# Patient Record
Sex: Male | Born: 1990
Health system: Southern US, Community
[De-identification: ages and names within clinical notes are randomized; demographics above are authoritative.]

## PROBLEM LIST (undated history)

## (undated) DIAGNOSIS — R109 Unspecified abdominal pain: Secondary | ICD-10-CM

## (undated) DIAGNOSIS — J45909 Unspecified asthma, uncomplicated: Secondary | ICD-10-CM

## (undated) DIAGNOSIS — H9190 Unspecified hearing loss, unspecified ear: Secondary | ICD-10-CM

## (undated) DIAGNOSIS — J309 Allergic rhinitis, unspecified: Secondary | ICD-10-CM

## (undated) HISTORY — DX: Unspecified asthma, uncomplicated: J45.909

## (undated) HISTORY — PX: EAR TUBE REMOVAL: SHX1486

## (undated) HISTORY — PX: OTHER SURGICAL HISTORY: SHX169

## (undated) HISTORY — DX: Allergic rhinitis, unspecified: J30.9

## (undated) HISTORY — DX: Unspecified abdominal pain: R10.9

## (undated) HISTORY — DX: Unspecified hearing loss, unspecified ear: H91.90

---
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
## 2010-05-23 HISTORY — PX: SHOULDER SURGERY: SHX246

## 2010-06-04 ENCOUNTER — Ambulatory Visit: Payer: Self-pay | Admitting: Family Medicine

## 2010-12-21 ENCOUNTER — Ambulatory Visit: Payer: Self-pay | Admitting: Orthopedic Surgery

## 2011-02-14 ENCOUNTER — Ambulatory Visit: Payer: Self-pay | Admitting: Orthopedic Surgery

## 2012-11-10 ENCOUNTER — Emergency Department: Payer: Self-pay | Admitting: Internal Medicine

## 2013-11-11 ENCOUNTER — Emergency Department: Payer: Self-pay | Admitting: Emergency Medicine

## 2014-03-14 ENCOUNTER — Emergency Department: Payer: Self-pay | Admitting: Emergency Medicine

## 2014-12-08 ENCOUNTER — Ambulatory Visit (INDEPENDENT_AMBULATORY_CARE_PROVIDER_SITE_OTHER): Payer: Self-pay | Admitting: Family Medicine

## 2014-12-08 ENCOUNTER — Encounter: Payer: Self-pay | Admitting: Family Medicine

## 2014-12-08 VITALS — BP 110/72 | HR 59 | Temp 98.7°F | Resp 16 | Ht 60.0 in | Wt 181.4 lb

## 2014-12-08 DIAGNOSIS — Z8669 Personal history of other diseases of the nervous system and sense organs: Secondary | ICD-10-CM | POA: Insufficient documentation

## 2014-12-08 DIAGNOSIS — J302 Other seasonal allergic rhinitis: Secondary | ICD-10-CM | POA: Insufficient documentation

## 2014-12-08 DIAGNOSIS — R51 Headache: Secondary | ICD-10-CM

## 2014-12-08 DIAGNOSIS — G8929 Other chronic pain: Secondary | ICD-10-CM | POA: Insufficient documentation

## 2014-12-08 DIAGNOSIS — J309 Allergic rhinitis, unspecified: Secondary | ICD-10-CM

## 2014-12-08 DIAGNOSIS — J45909 Unspecified asthma, uncomplicated: Secondary | ICD-10-CM | POA: Insufficient documentation

## 2014-12-08 DIAGNOSIS — J029 Acute pharyngitis, unspecified: Secondary | ICD-10-CM

## 2014-12-08 DIAGNOSIS — R519 Headache, unspecified: Secondary | ICD-10-CM | POA: Insufficient documentation

## 2014-12-08 HISTORY — DX: Allergic rhinitis, unspecified: J30.9

## 2014-12-08 LAB — POCT RAPID STREP A (OFFICE): Rapid Strep A Screen: NEGATIVE

## 2014-12-08 MED ORDER — AMOXICILLIN 875 MG PO TABS: 875.0000 mg | ORAL_TABLET | Freq: Two times a day (BID) | ORAL | Status: DC

## 2014-12-08 NOTE — Progress Notes (Signed)
Subjective:     Patient ID: Kyle MandrilMatthew D Marshall, male   DOB: 1990-06-25, 24 y.o.   MRN: [id number]  HPI  Chief Complaint  Patient presents with  . Sore Throat    patient comes in office today with complaints of sore throat x days. Patient describes throat as burning and painful when swallowing liquids or solids.   No cold sx reported but states he treated allergy sx two weeks ago with improvement. Does report a lot of heartburn in association with heavy use of soda and more than one pillow at night. He is accompanied by his wife and young daughter.   Review of Systems  Constitutional: Negative for fever and chills.  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 Usually takes two BC's twice daily which he states irritates his stomach.       Objective:   Physical Exam  Constitutional: He appears well-developed and well-nourished.  Ears: T.M's intact without inflammation Throat: moderate erythema with mild tonsillar enlargement but no exudate Neck: no cervical adenopathy Lungs: clear     Assessment:    1. Pharyngitis-? Mediated by reflux ? infectious - POCT rapid strep A - amoxicillin (AMOXIL) 875 MG tablet; Take 1 tablet (875 mg total) by mouth 2 (two) times daily.  Dispense: 20 tablet; Refill: 0    Plan:    Try reflux relief measures first with HOB elevation, decreased soda intake, and use of Zantac 75 at bedtime. If throat not improving start amoxil. May try ibuprofen 400 mg. with meals for knee pain.

## 2014-12-08 NOTE — Patient Instructions (Signed)
Start amoxicillin if your throat does not improve in the next 24 hours. For heartburn put the head of your bed legs on 6 inch blocks and decrease amount of caffeine your are drinking. May consider taking Zantac 75 for two to four weeks. For knee pain try two ibuprofen with food 3 x day with meals.

## 2015-01-01 ENCOUNTER — Encounter: Payer: Self-pay | Admitting: *Deleted

## 2015-01-01 ENCOUNTER — Emergency Department
Admission: EM | Admit: 2015-01-01 | Discharge: 2015-01-01 | Disposition: A | Discharge status: Home / Self Care | Payer: Medicaid Other | Attending: Emergency Medicine | Admitting: Emergency Medicine

## 2015-01-01 ENCOUNTER — Other Ambulatory Visit: Payer: Self-pay

## 2015-01-01 DIAGNOSIS — R110 Nausea: Secondary | ICD-10-CM | POA: Insufficient documentation

## 2015-01-01 DIAGNOSIS — Z79899 Other long term (current) drug therapy: Secondary | ICD-10-CM | POA: Insufficient documentation

## 2015-01-01 DIAGNOSIS — R55 Syncope and collapse: Secondary | ICD-10-CM | POA: Insufficient documentation

## 2015-01-01 DIAGNOSIS — Z792 Long term (current) use of antibiotics: Secondary | ICD-10-CM | POA: Diagnosis not present

## 2015-01-01 LAB — CBC
HCT: 44.4 % (ref 40.0–52.0)
Hemoglobin: 15.1 g/dL (ref 13.0–18.0)
MCH: 31.2 pg (ref 26.0–34.0)
MCHC: 33.9 g/dL (ref 32.0–36.0)
MCV: 92.1 fL (ref 80.0–100.0)
PLATELETS: 200 10*3/uL (ref 150–440)
RBC: 4.83 MIL/uL (ref 4.40–5.90)
RDW: 13.2 % (ref 11.5–14.5)
WBC: 6.9 10*3/uL (ref 3.8–10.6)

## 2015-01-01 LAB — BASIC METABOLIC PANEL
Anion gap: 4 — ABNORMAL LOW (ref 5–15)
BUN: 9 mg/dL (ref 6–20)
CALCIUM: 9.2 mg/dL (ref 8.9–10.3)
CO2: 26 mmol/L (ref 22–32)
CREATININE: 0.92 mg/dL (ref 0.61–1.24)
Chloride: 108 mmol/L (ref 101–111)
GFR calc Af Amer: >60 mL/min (ref >60)
GFR calc non Af Amer: >60 mL/min (ref >60)
Glucose, Bld: 96 mg/dL (ref 65–99)
Potassium: 4.8 mmol/L (ref 3.5–5.1)
SODIUM: 138 mmol/L (ref 135–145)

## 2015-01-01 LAB — TROPONIN I

## 2015-01-01 MED ORDER — PANTOPRAZOLE SODIUM 40 MG PO TBEC: 40.0000 mg | DELAYED_RELEASE_TABLET | Freq: Every day | ORAL | Status: DC

## 2015-01-01 MED ORDER — ONDANSETRON 4 MG PO TBDP: 4.0000 mg | ORAL_TABLET | Freq: Three times a day (TID) | ORAL | Status: DC | PRN

## 2015-01-01 NOTE — ED Notes (Signed)
Today developed nausea,.had syncope episode witnessed by gf, had some shakes, his left arm went numb has been doing this for several months, , recived zofran per ems

## 2015-01-01 NOTE — Discharge Instructions (Signed)
Vasovagal Syncope, Adult °Syncope, commonly known as fainting, is a temporary loss of consciousness. It occurs when the blood flow to the brain is reduced. Vasovagal syncope (also called neurocardiogenic syncope) is a fainting spell in which the blood flow to the brain is reduced because of a sudden drop in heart rate and blood pressure. Vasovagal syncope occurs when the brain and the cardiovascular system (blood vessels) do not adequately communicate and respond to each other. This is the most common cause of fainting. It often occurs in response to fear or some other type of emotional or physical stress. The body has a reaction in which the heart starts beating too slowly or the blood vessels expand, reducing blood pressure. This type of fainting spell is generally considered harmless. However, injuries can occur if a person takes a sudden fall during a fainting spell.  °CAUSES  °Vasovagal syncope occurs when a person's blood pressure and heart rate decrease suddenly, usually in response to a trigger. Many things and situations can trigger an episode. Some of these include:  °· Pain.   °· Fear.   °· The sight of blood or medical procedures, such as blood being drawn from a vein.   °· Common activities, such as coughing, swallowing, stretching, or going to the bathroom.   °· Emotional stress.   °· Prolonged standing, especially in a warm environment.   °· Lack of sleep or rest.   °· Prolonged lack of food.   °· Prolonged lack of fluids.   °· Recent illness. °· The use of certain drugs that affect blood pressure, such as cocaine, alcohol, marijuana, inhalants, and opiates.   °SYMPTOMS  °Before the fainting episode, you may:  °· Feel dizzy or light headed.   °· Become pale. °· Sense that you are going to faint.   °· Feel like the room is spinning.   °· Have tunnel vision, only seeing directly in front of you.   °· Feel sick to your stomach (nauseous).   °· See spots or slowly lose vision.   °· Hear ringing in your  ears.   °· Have a headache.   °· Feel warm and sweaty.   °· Feel a sensation of pins and needles. °During the fainting spell, you will generally be unconscious for no longer than a couple minutes before waking up and returning to normal. If you get up too quickly before your body can recover, you may faint again. Some twitching or jerky movements may occur during the fainting spell.  °DIAGNOSIS  °Your caregiver will ask about your symptoms, take a medical history, and perform a physical exam. Various tests may be done to rule out other causes of fainting. These may include blood tests and tests to check the heart, such as electrocardiography, echocardiography, and possibly an electrophysiology study. When other causes have been ruled out, a test may be done to check the body's response to changes in position (tilt table test). °TREATMENT  °Most cases of vasovagal syncope do not require treatment. Your caregiver may recommend ways to avoid fainting triggers and may provide home strategies for preventing fainting. If you must be exposed to a possible trigger, you can drink additional fluids to help reduce your chances of having an episode of vasovagal syncope. If you have warning signs of an oncoming episode, you can respond by positioning yourself favorably (lying down). °If your fainting spells continue, you may be given medicines to prevent fainting. Some medicines may help make you more resistant to repeated episodes of vasovagal syncope. Special exercises or compression stockings may be recommended. In rare cases, the surgical placement   of a pacemaker is considered. °HOME CARE INSTRUCTIONS  °· Learn to identify the warning signs of vasovagal syncope.   °· Sit or lie down at the first warning sign of a fainting spell. If sitting, put your head down between your legs. If you lie down, swing your legs up in the air to increase blood flow to the brain.   °· Avoid hot tubs and saunas. °· Avoid prolonged  standing. °· Drink enough fluids to keep your urine clear or pale yellow. Avoid caffeine. °· Increase salt in your diet as directed by your caregiver.   °· If you have to stand for a long time, perform movements such as:   °¨ Crossing your legs.   °¨ Flexing and stretching your leg muscles.   °¨ Squatting.   °¨ Moving your legs.   °¨ Bending over.   °· Only take over-the-counter or prescription medicines as directed by your caregiver. Do not suddenly stop any medicines without asking your caregiver first.  °SEEK MEDICAL CARE IF:  °· Your fainting spells continue or happen more frequently in spite of treatment.   °· You lose consciousness for more than a couple minutes. °· You have fainting spells during or after exercising or after being startled.   °· You have new symptoms that occur with the fainting spells, such as:   °¨ Shortness of breath. °¨ Chest pain.   °¨ Irregular heartbeat.   °· You have episodes of twitching or jerky movements that last longer than a few seconds. °· You have episodes of twitching or jerky movements without obvious fainting. °SEEK IMMEDIATE MEDICAL CARE IF:  °· You have injuries or bleeding after a fainting spell.   °· You have episodes of twitching or jerky movements that last longer than 5 minutes.   °· You have more than one spell of twitching or jerky movements before returning to consciousness after fainting. °MAKE SURE YOU:  °· Understand these instructions. °· Will watch your condition. °· Will get help right away if you are not doing well or get worse. °Document Released: 04/25/2012 Document Reviewed: 04/25/2012 °Exit[employer]® Patient Information ©2015 [employer]. This information is not intended to replace advice given to you by your health care provider. Make sure you discuss any questions you have with your health care provider. ° °

## 2015-01-01 NOTE — ED Provider Notes (Signed)
[facility] Emergency Department Provider Note  Time seen: 2:44 PM  I have reviewed the triage vital signs and the nursing notes.   HISTORY  Chief Complaint Loss of Consciousness    HPI Kyle Marshall is a 24 y.o. male with a past medical history of asthma who presents the emergency department after a near syncopal episode. According to the patient he has been feeling nauseated all day today. He got out of his car and he felt very lightheaded and nauseated. He laid his head down on the car, the girlfriend states his eyes rolled back in his head and he passed out or nearly passed out. They called EMS who came and transported the patient to the emergency department. Patient received Zofran and stated that the nausea completely resolved and he felt normal. Upon arrival to the emergency department denies any symptoms, nausea and dizziness/lightheadedness have resolved. Patient denies chest pain or shortness of breath at any time. Denies abdominal pain, but states nausea times several weeks intermittently. He also states a burning sensation especially at night in his epigastrium going up to his throat. He has a history of gastric reflux, has been taking toms without much relief.     Past Medical History  Diagnosis Date  . Asthma     Patient Active Problem List   Diagnosis Date Noted  . Asthma 12/08/2014  . Allergic rhinitis 12/08/2014    Past Surgical History  Procedure Laterality Date  . Appendectomy  1997  . Shoulder surgery Right 2012    Current Outpatient Rx  Name  Route  Sig  Dispense  Refill  . albuterol (PROVENTIL HFA;VENTOLIN HFA) 108 (90 BASE) MCG/ACT inhaler   Inhalation   Inhale 2 puffs into the lungs every 4 (four) hours as needed for wheezing or shortness of breath.         Marland Kitchen amoxicillin (AMOXIL) 875 MG tablet   Oral   Take 1 tablet (875 mg total) by mouth 2 (two) times daily.   20 tablet   0   . Aspirin-Salicylamide-Caffeine (BC  HEADACHE POWDER PO)   Oral   Take by mouth as needed.         . loratadine (CLARITIN) 10 MG tablet   Oral   Take 10 mg by mouth daily.           Allergies Vicodin  Family History  Problem Relation Age of Onset  . Cancer Mother     breast  . Atrial fibrillation Mother   . Cancer Father     brain, lung     Social History Social History  Substance Use Topics  . Smoking status: Never Smoker   . Smokeless tobacco: Never Used  . Alcohol Use: No    Review of Systems Constitutional: Negative for fever. Cardiovascular: Negative for chest pain. Respiratory: Negative for shortness of breath. Gastrointestinal: Epigastric burning at times, especially at night when lying flat. Positive for nausea. Negative for vomiting or diarrhea Neurological: Negative for headache 10-point ROS otherwise negative.  ____________________________________________   PHYSICAL EXAM:  VITAL SIGNS: ED Triage Vitals  Enc Vitals Group     BP 01/01/15 1053 142/88 mmHg     Pulse Rate 01/01/15 1053 68     Resp --      Temp 01/01/15 1053 98.6 F (37 C)     Temp Source 01/01/15 1053 Oral     SpO2 01/01/15 1053 98 %     Weight 01/01/15 1053 181 lb (82.101 kg)  Height 01/01/15 1053  (1.651 m)     Head Cir --      Peak Flow --      Pain Score --      Pain Loc --      Pain Edu? --      Excl. in GC? --     Constitutional: Alert and oriented. Well appearing and in no distress. Eyes: Normal exam ENT   Mouth/Throat: Mucous membranes are moist. Cardiovascular: Normal rate, regular rhythm. No murmur Respiratory: Normal respiratory effort without tachypnea nor retractions. Breath sounds are clear and equal bilaterally. No wheezes/rales/rhonchi. Gastrointestinal: Soft and nontender. No distention.   Musculoskeletal: Nontender with normal range of motion in all extremities.  Neurologic:  Normal speech and language. No gross focal neurologic deficits Skin:  Skin is warm, dry and intact.   Psychiatric: Mood and affect are normal. Speech and behavior are normal.  ____________________________________________    EKG  EKG reviewed and interpreted by myself shows normal sinus rhythm at 66 bpm, narrow QRS, normal axis, normal intervals, nonspecific but no concerning ST changes noted.  ____________________________________________    INITIAL IMPRESSION / ASSESSMENT AND PLAN / ED COURSE  Pertinent labs & imaging results that were available during my care of the patient were reviewed by me and considered in my medical decision making (see chart for details).  EKG does not show any acute/concerning abnormalities. Labs are within normal limits. Patient presents with a syncopal versus near syncopal episode. Preceded by nausea, most consistent with vagal episode. Patient also complaining of epigastric burning consistent with reflux, worse at night. I discussed with the patient the need to follow up with a cardiologist for a Holter monitor as the girlfriend states this is his third or fourth episode in the past 4 weeks. We will also discharge the patient with Protonix, and Zofran as needed. I discussed this with the patient is agreeable. Also discussed very strict return precautions for any additional episodes, chest pain, or trouble breathing.  ____________________________________________   FINAL CLINICAL IMPRESSION(S) / ED DIAGNOSES  Syncope   Minna Antis, MD 01/01/15 [phone]

## 2015-01-01 NOTE — ED Notes (Signed)
Had near to possible syncope this am, gf states his eyes rolled back into his head, received zofran from ems and now feels much better

## 2015-01-08 ENCOUNTER — Encounter: Payer: Self-pay | Admitting: Gastroenterology

## 2015-01-12 ENCOUNTER — Ambulatory Visit (INDEPENDENT_AMBULATORY_CARE_PROVIDER_SITE_OTHER): Payer: Self-pay | Admitting: Family Medicine

## 2015-01-12 ENCOUNTER — Encounter: Payer: Self-pay | Admitting: Family Medicine

## 2015-01-12 ENCOUNTER — Telehealth: Payer: Self-pay | Admitting: Family Medicine

## 2015-01-12 VITALS — BP 138/88 | HR 66 | Temp 98.0°F | Resp 16 | Ht 65.0 in | Wt 182.4 lb

## 2015-01-12 DIAGNOSIS — F419 Anxiety disorder, unspecified: Secondary | ICD-10-CM

## 2015-01-12 DIAGNOSIS — K219 Gastro-esophageal reflux disease without esophagitis: Secondary | ICD-10-CM

## 2015-01-12 MED ORDER — CLONAZEPAM 0.5 MG PO TABS: ORAL_TABLET | ORAL | Status: DC

## 2015-01-12 NOTE — Telephone Encounter (Signed)
FYI

## 2015-01-12 NOTE — Patient Instructions (Signed)
Take pantoprazole in the evening and continue head of bed elevation.

## 2015-01-12 NOTE — Telephone Encounter (Signed)
Pt was discharged from [facility] ER 12/25/2014 due to passing out.  Pt is scheduled to come in today for a hospital follow up/MW

## 2015-01-12 NOTE — Progress Notes (Signed)
Subjective:     Patient ID: Kyle Marshall, male   DOB: 02-09-1991, 24 y.o.   MRN: [id number]  HPI  Chief Complaint  Patient presents with  . Follow-up    Patient is present in office today for hospital follow up. Patient was transported to [facility] ER by EMS on 01/01/15 for near syncope episode, patient girlfriend had stated to responders that patients eyes had rolled in the back of his head and patient had complaints of numbness in the left arm. EKG in hospital was normal and patient was prescribed Zofran and acid reducer to take home. Patient reports that he no longer has numbness but still having nausea  "I feel much better now". States he has had similar sx in the past associated with nocturnal reflux sx and awakening nausea. 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 Full time [employer] with a wife and young daughter. Reports mother has breast cancer and numerous other relatives have had cancer in a variety of organs. He has elevated his HOB on blocks and has an appointment with G.I., Dr. Servando Snare, 02/10/15. His wife and daughter accompany him.   Review of Systems     Objective:   Physical Exam  Constitutional: He appears well-developed and well-nourished.  Psychiatric:  anxious       Assessment:    1. Gastroesophageal reflux disease without esophagitis: Continue current measures and pending G.i. Evaluation.   2. Acute anxiety - clonazePAM (KLONOPIN) 0.5 MG tablet; 1/2 to one as needed for anxiety/stress  Dispense: 20 tablet; Refill: 1    Plan:    Telephone f/u in one week. Discussed relationship of physical sx with anxiety.

## 2015-02-09 DIAGNOSIS — H9190 Unspecified hearing loss, unspecified ear: Secondary | ICD-10-CM

## 2015-02-09 HISTORY — DX: Unspecified hearing loss, unspecified ear: H91.90

## 2015-02-10 ENCOUNTER — Ambulatory Visit: Payer: Self-pay | Admitting: Gastroenterology

## 2015-02-11 ENCOUNTER — Encounter: Payer: Self-pay | Admitting: Family Medicine

## 2015-02-11 ENCOUNTER — Ambulatory Visit (INDEPENDENT_AMBULATORY_CARE_PROVIDER_SITE_OTHER): Payer: Self-pay | Admitting: Family Medicine

## 2015-02-11 VITALS — BP 118/84 | HR 76 | Temp 98.5°F | Resp 16 | Wt 176.2 lb

## 2015-02-11 DIAGNOSIS — K529 Noninfective gastroenteritis and colitis, unspecified: Secondary | ICD-10-CM

## 2015-02-11 DIAGNOSIS — A09 Infectious gastroenteritis and colitis, unspecified: Secondary | ICD-10-CM

## 2015-02-11 MED ORDER — ONDANSETRON 8 MG PO TBDP: 8.0000 mg | ORAL_TABLET | Freq: Three times a day (TID) | ORAL | Status: DC | PRN

## 2015-02-11 NOTE — Progress Notes (Signed)
Subjective:     Patient ID: ZESHAN SENA, male   DOB: 11-03-1990, 24 y.o.   MRN: [id number]  HPI  Chief Complaint  Patient presents with  . Emesis    Patient comes in office today with concerns of vomiting and diarrhea since Sunday 9/18, patient states early in the day he had ate at [facility]. Associated symptoms include loss of appetite and body chills, patient reports that he has not taken anything over the counter   "I can't keep anything down". Has tried to keep up with fluid intake with Sprite and Gatorade. Reports watery stools are less frequent but he has not attempted to eat. Accompanied by his wife and daughter who are not similarly ill.  Review of Systems     Objective:   Physical Exam  Constitutional: He appears well-developed and well-nourished. He has a sickly appearance (lying on exam table.).  HENT:  Oral mucosa/tongue moist  Abdominal: Soft. There is no tenderness.  Diminished bowel sounds       Assessment:    1. Gastroenteritis presumed infectious - ondansetron (ZOFRAN ODT) 8 MG disintegrating tablet; Take 1 tablet (8 mg total) by mouth every 8 (eight) hours as needed for nausea or vomiting.  Dispense: 12 tablet; Refill: 0    Plan:    Encouraged p.o. Intake and use of imodium as needed. Work excuse for 9/19-9/23

## 2015-02-11 NOTE — Patient Instructions (Signed)
Discussed use of imodium. Continue to keep up with your fluids with Gatorade (a sip every 5 minutes) and eat as tolerated.

## 2015-03-18 ENCOUNTER — Ambulatory Visit (INDEPENDENT_AMBULATORY_CARE_PROVIDER_SITE_OTHER): Payer: Self-pay | Admitting: Family Medicine

## 2015-03-18 ENCOUNTER — Encounter: Payer: Self-pay | Admitting: Family Medicine

## 2015-03-18 VITALS — BP 138/96 | HR 80 | Temp 98.3°F | Resp 16 | Wt 177.0 lb

## 2015-03-18 DIAGNOSIS — B370 Candidal stomatitis: Secondary | ICD-10-CM

## 2015-03-18 DIAGNOSIS — R110 Nausea: Secondary | ICD-10-CM

## 2015-03-18 DIAGNOSIS — R55 Syncope and collapse: Secondary | ICD-10-CM

## 2015-03-18 DIAGNOSIS — J069 Acute upper respiratory infection, unspecified: Secondary | ICD-10-CM

## 2015-03-18 MED ORDER — NYSTATIN 100000 UNIT/ML MT SUSP: 5.0000 mL | Freq: Four times a day (QID) | OROMUCOSAL | Status: DC

## 2015-03-18 MED ORDER — IBUPROFEN 200 MG PO CAPS: 800.0000 mg | ORAL_CAPSULE | Freq: Three times a day (TID) | ORAL | Status: DC | PRN

## 2015-03-18 NOTE — Progress Notes (Signed)
Subjective:    Patient ID: Kyle Marshall, male    DOB: December 05, 1990, 24 y.o.   MRN: [id number]  URI  This is a new problem. The current episode started yesterday. The problem has been gradually worsening. There has been no fever. Associated symptoms include congestion, coughing, diarrhea, headaches, joint pain (pt's baseline), nausea (pt has vertigo, reports a syncopal episode last night), rhinorrhea, sneezing, a sore throat and swollen glands. Pertinent negatives include no abdominal pain, chest pain, dysuria, ear pain, joint swelling, neck pain, plugged ear sensation, rash, sinus pain, vomiting or wheezing. Treatments tried: Alka seltzer  The treatment provided no relief.   Had syncopal episode last night. Has had that happen before. Started about 4 months ago.  Has vertigo.  Did see ENT.  Has maneuvers done and got better, but now recurred. Last night, had eating all of his food, can not really remember what happened. Remember having nausea and dizziness, tried to get to the bed and passed out.  Dizziness is better now.  Feels some better.  Has high blood pressure at times.    Review of Systems  HENT: Positive for congestion, rhinorrhea, sneezing and sore throat. Negative for ear pain.   Respiratory: Positive for cough. Negative for wheezing.   Cardiovascular: Negative for chest pain.  Gastrointestinal: Positive for nausea (pt has vertigo, reports a syncopal episode last night) and diarrhea. Negative for vomiting and abdominal pain.  Genitourinary: Negative for dysuria.  Musculoskeletal: Positive for joint pain (pt's baseline). Negative for neck pain.  Skin: Negative for rash.  Neurological: Positive for syncope and headaches.   BP 138/96 mmHg  Pulse 80  Temp(Src) 98.3 F (36.8 C) (Oral)  Resp 16  Wt 177 lb (80.287 kg)  SpO2 98%   Patient Active Problem List   Diagnosis Date Noted  . Asthma 12/08/2014  . Allergic rhinitis 12/08/2014  . Abdominal pain 02/12/2008   Past Medical  History  Diagnosis Date  . Asthma   . Abdominal pain 02/12/2008  . Difficulty hearing 02/09/2015  . Allergic rhinitis 12/08/2014   Current Outpatient Prescriptions on File Prior to Visit  Medication Sig  . albuterol (PROVENTIL HFA;VENTOLIN HFA) 108 (90 BASE) MCG/ACT inhaler Inhale 2 puffs into the lungs every 4 (four) hours as needed for wheezing or shortness of breath.  . Aspirin-Salicylamide-Caffeine (BC HEADACHE POWDER PO) Take by mouth as needed.  . loratadine (CLARITIN) 10 MG tablet Take 10 mg by mouth daily.  . clonazePAM (KLONOPIN) 0.5 MG tablet 1/2 to one as needed for anxiety/stress (Patient not taking: Reported on 03/18/2015)  . esomeprazole (NEXIUM) 40 MG capsule Take 40 mg by mouth daily at 12 noon.  . ondansetron (ZOFRAN ODT) 8 MG disintegrating tablet Take 1 tablet (8 mg total) by mouth every 8 (eight) hours as needed for nausea or vomiting. (Patient not taking: Reported on 03/18/2015)   No current facility-administered medications on file prior to visit.   Allergies  Allergen Reactions  . Vicodin [Hydrocodone-Acetaminophen] Nausea And Vomiting   Past Surgical History  Procedure Laterality Date  . Appendectomy  1997  . Shoulder surgery Right 2012   Social History   Social History  . Marital Status: Married    Spouse Name: N/A  . Number of Children: N/A  . Years of Education: N/A   Occupational History  . Not on file.   Social History Main Topics  . Smoking status: Never Smoker   . Smokeless tobacco: Never Used  . Alcohol Use: No  .  Drug Use: Not on file  . Sexual Activity: Not on file   Other Topics Concern  . Not on file   Social History Narrative   Family History  Problem Relation Age of Onset  . Cancer Mother     breast  . Atrial fibrillation Mother   . Cancer Father     brain, lung       Objective:   Physical Exam  Constitutional: He is oriented to person, place, and time. He appears well-developed and well-nourished.  HENT:  Head:  Normocephalic and atraumatic.  Bilateral scar tissue and damaged TMs noted.  Tongue white.   Eyes: Conjunctivae are normal. Pupils are equal, round, and reactive to light.  Neck: Normal range of motion. Neck supple.  Cardiovascular: Normal rate and regular rhythm.   Pulmonary/Chest: Effort normal and breath sounds normal.  Neurological: He is alert and oriented to person, place, and time.  Psychiatric: He has a normal mood and affect. His behavior is normal. Judgment and thought content normal.   BP 138/96 mmHg  Pulse 80  Temp(Src) 98.3 F (36.8 C) (Oral)  Resp 16  Wt 177 lb (80.287 kg)  SpO2 98%     Assessment & Plan:  1. Upper respiratory infection Treat symptomatically. Patient instructed to call back if condition worsens or does not improve.  Strep negative.    - Ibuprofen (EQ IBUPROFEN) 200 MG CAPS; Take 4 capsules (800 mg total) by mouth 3 (three) times daily as needed.  Dispense: 1 each; Refill: 0  2. Thrush Will treat today. Patient instructed to call back if condition worsens or does not improve.    - nystatin (MYCOSTATIN) 100000 UNIT/ML suspension; Take 5 mLs (500,000 Units total) by mouth 4 (four) times daily.  Dispense: 60 mL; Refill: 0  3. Vasovagal near syncope Suspect vasovagal episode. Gave handout to try to examine triggers. May need cardiology referral, but has no insurance right now, would like to wait. Will call if patient worsens and/or when needs referral.    4. Nausea Stable.  Unclear if needs GI referral. Will call if symptoms do not improve.   Lorie PhenixNancy Sharna Gabrys, MD

## 2015-03-18 NOTE — Patient Instructions (Signed)
Syncope, commonly known as fainting, is a temporary loss of consciousness. It occurs when the blood flow to the brain is reduced. Vasovagal syncope (also called neurocardiogenic syncope) is a fainting spell in which the blood flow to the brain is reduced because of a sudden drop in heart rate and blood pressure. Vasovagal syncope occurs when the brain and the cardiovascular system (blood vessels) do not adequately communicate and respond to each other. This is the most common cause of fainting. It often occurs in response to fear or some other type of emotional or physical stress. The body has a reaction in which the heart starts beating too slowly or the blood vessels expand, reducing blood pressure. This type of fainting spell is generally considered harmless. However, injuries can occur if a person takes a sudden fall during a fainting spell.   CAUSES   Vasovagal syncope occurs when a person's blood pressure and heart rate decrease suddenly, usually in response to a trigger. Many things and situations can trigger an episode. Some of these include:    Pain.    Fear.    The sight of blood or medical procedures, such as blood being drawn from a vein.    Common activities, such as coughing, swallowing, stretching, or going to the bathroom.    Emotional stress.    Prolonged standing, especially in a warm environment.    Lack of sleep or rest.    Prolonged lack of food.    Prolonged lack of fluids.    Recent illness.   The use of certain drugs that affect blood pressure, such as cocaine, alcohol, marijuana, inhalants, and opiates.   SYMPTOMS   Before the fainting episode, you may:    Feel dizzy or light headed.    Become pale.   Sense that you are going to faint.    Feel like the room is spinning.    Have tunnel vision, only seeing directly in front of you.    Feel sick to your stomach (nauseous).    See spots or slowly lose vision.    Hear ringing in your ears.    Have a headache.     Feel warm and sweaty.    Feel a sensation of pins and needles.  During the fainting spell, you will generally be unconscious for no longer than a couple minutes before waking up and returning to normal. If you get up too quickly before your body can recover, you may faint again. Some twitching or jerky movements may occur during the fainting spell.   DIAGNOSIS   Your health care provider will ask about your symptoms, take a medical history, and perform a physical exam. Various tests may be done to rule out other causes of fainting. These may include blood tests and tests to check the heart, such as electrocardiography, echocardiography, and possibly an electrophysiology study. When other causes have been ruled out, a test may be done to check the body's response to changes in position (tilt table test).  TREATMENT   Most cases of vasovagal syncope do not require treatment. Your health care provider may recommend ways to avoid fainting triggers and may provide home strategies for preventing fainting. If you must be exposed to a possible trigger, you can drink additional fluids to help reduce your chances of having an episode of vasovagal syncope. If you have warning signs of an oncoming episode, you can respond by positioning yourself favorably (lying down).  If your fainting spells continue, you may be   given medicines to prevent fainting. Some medicines may help make you more resistant to repeated episodes of vasovagal syncope. Special exercises or compression stockings may be recommended. In rare cases, the surgical placement of a pacemaker is considered.  HOME CARE INSTRUCTIONS    Learn to identify the warning signs of vasovagal syncope.    Sit or lie down at the first warning sign of a fainting spell. If sitting, put your head down between your legs. If you lie down, swing your legs up in the air to increase blood flow to the brain.    Avoid hot tubs and saunas.   Avoid prolonged standing.   Drink  enough fluids to keep your urine clear or pale yellow. Avoid caffeine.   Increase salt in your diet as directed by your health care provider.    If you have to stand for a long time, perform movements such as:     Crossing your legs.     Flexing and stretching your leg muscles.     Squatting.     Moving your legs.     Bending over.    Only take over-the-counter or prescription medicines as directed by your health care provider. Do not suddenly stop any medicines without asking your health care provider first.  SEEK MEDICAL CARE IF:    Your fainting spells continue or happen more frequently in spite of treatment.    You lose consciousness for more than a couple minutes.   You have fainting spells during or after exercising or after being startled.    You have new symptoms that occur with the fainting spells, such as:     Shortness of breath.    Chest pain.     Irregular heartbeat.    You have episodes of twitching or jerky movements that last longer than a few seconds.   You have episodes of twitching or jerky movements without obvious fainting.  SEEK IMMEDIATE MEDICAL CARE IF:    You have injuries or bleeding after a fainting spell.    You have episodes of twitching or jerky movements that last longer than 5 minutes.    You have more than one spell of twitching or jerky movements before returning to consciousness after fainting.     This information is not intended to replace advice given to you by your health care provider. Make sure you discuss any questions you have with your health care provider.     Document Released: 04/25/2012 Document Revised: 09/23/2014 Document Reviewed: 04/25/2012  Elsevier Interactive Patient Education 2016 [employer]

## 2015-03-19 LAB — POCT RAPID STREP A (OFFICE): Rapid Strep A Screen: NEGATIVE

## 2015-03-19 NOTE — Addendum Note (Signed)
Addended by: Leo GrosserMALONEY, Rosaelena Kemnitz J on: 03/19/2015 10:37 AM   Modules accepted: Orders, SmartSet

## 2015-06-03 ENCOUNTER — Encounter: Payer: Self-pay | Admitting: Internal Medicine

## 2015-06-03 ENCOUNTER — Ambulatory Visit (INDEPENDENT_AMBULATORY_CARE_PROVIDER_SITE_OTHER): Payer: Medicaid Other | Admitting: Internal Medicine

## 2015-06-03 VITALS — BP 148/106 | HR 83 | Temp 98.3°F | Resp 12 | Ht 65.0 in | Wt 188.1 lb

## 2015-06-03 DIAGNOSIS — R030 Elevated blood-pressure reading, without diagnosis of hypertension: Secondary | ICD-10-CM

## 2015-06-03 DIAGNOSIS — M159 Polyosteoarthritis, unspecified: Secondary | ICD-10-CM | POA: Diagnosis not present

## 2015-06-03 DIAGNOSIS — Z87898 Personal history of other specified conditions: Secondary | ICD-10-CM

## 2015-06-03 DIAGNOSIS — F411 Generalized anxiety disorder: Secondary | ICD-10-CM | POA: Diagnosis not present

## 2015-06-03 DIAGNOSIS — J4520 Mild intermittent asthma, uncomplicated: Secondary | ICD-10-CM | POA: Diagnosis not present

## 2015-06-03 DIAGNOSIS — Z9189 Other specified personal risk factors, not elsewhere classified: Secondary | ICD-10-CM

## 2015-06-03 MED ORDER — TRAMADOL HCL 50 MG PO TABS: 50.0000 mg | ORAL_TABLET | Freq: Three times a day (TID) | ORAL | Status: DC | PRN

## 2015-06-03 MED ORDER — MELOXICAM 15 MG PO TABS: 15.0000 mg | ORAL_TABLET | Freq: Every day | ORAL | Status: DC

## 2015-06-03 NOTE — Progress Notes (Signed)
Pre-visit discussion using our clinic review tool. No additional management support is needed unless otherwise documented below in the visit note.  

## 2015-06-03 NOTE — Patient Instructions (Addendum)
Take the Nexium in the early AM BEFORE you eat .  It will protect your stomach better   I am prescribing tramadol for your pain ,  You can use it up to 4 times daily along with tylenol.   I am also prescribing meloxicam instead of ibuprofen,  But DO NOT TAKE EITHER ONE FOR 4 DAYS SO WE CAN CHECK YOUR BLOOD PRESSURE without them being on board. NO GOOD'S POWDERS EITHER BC THEY CONTIAIN CAFFEINE.  (ok to take tramadol, tylenol )  Please refrain  From drinking your "Energy Drink' for 4 days as well  Check your blood pressure  on days 3 and 4 and send readings to office
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
## 2015-06-06 DIAGNOSIS — M159 Polyosteoarthritis, unspecified: Secondary | ICD-10-CM | POA: Insufficient documentation

## 2015-06-06 DIAGNOSIS — F411 Generalized anxiety disorder: Secondary | ICD-10-CM | POA: Insufficient documentation

## 2015-06-06 DIAGNOSIS — R030 Elevated blood-pressure reading, without diagnosis of hypertension: Secondary | ICD-10-CM | POA: Insufficient documentation

## 2015-06-06 DIAGNOSIS — Z87898 Personal history of other specified conditions: Secondary | ICD-10-CM | POA: Insufficient documentation
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
## 2015-06-06 NOTE — Assessment & Plan Note (Signed)
Secondary to family and financial stressors.  He does not require medication and has prior intolerance of zanax and Klonipin due to excessive sedation.

## 2015-06-06 NOTE — Assessment & Plan Note (Addendum)
He has had diastolic elevations as far back as 2014 per review of [facility] data  Will stop energy drinks and NSAIDs and reassess. would like to consider using a beta blocker given his nervousness, but need to rule out actual asthma and without insurance,  This will need postponement.      Lab Results  Component Value Date   CREATININE 0.92 01/01/2015   No results found for: Concepcion ElkMICROALBUR, [id number]

## 2015-06-06 NOTE — Assessment & Plan Note (Signed)
Vs near syncope,  Details are unclear despite review of ER records and attempts to clarify with patient.  He has had no recurrence since his recurrent vertigo was treated successfullyby ENT.  .  ED records reviewed,  No further workup at this time.

## 2015-07-06 ENCOUNTER — Ambulatory Visit: Payer: Medicaid Other | Admitting: Internal Medicine

## 2015-07-06 ENCOUNTER — Ambulatory Visit (INDEPENDENT_AMBULATORY_CARE_PROVIDER_SITE_OTHER): Payer: Self-pay | Admitting: Internal Medicine

## 2015-07-06 ENCOUNTER — Encounter: Payer: Self-pay | Admitting: Internal Medicine

## 2015-07-06 VITALS — BP 142/80 | HR 85 | Temp 98.2°F | Resp 12 | Ht 65.0 in | Wt 184.2 lb

## 2015-07-06 DIAGNOSIS — R030 Elevated blood-pressure reading, without diagnosis of hypertension: Secondary | ICD-10-CM

## 2015-07-06 DIAGNOSIS — M159 Polyosteoarthritis, unspecified: Secondary | ICD-10-CM

## 2015-07-06 DIAGNOSIS — Z79899 Other long term (current) drug therapy: Secondary | ICD-10-CM

## 2015-07-06 DIAGNOSIS — E785 Hyperlipidemia, unspecified: Secondary | ICD-10-CM

## 2015-07-06 DIAGNOSIS — F411 Generalized anxiety disorder: Secondary | ICD-10-CM

## 2015-07-06 DIAGNOSIS — R748 Abnormal levels of other serum enzymes: Secondary | ICD-10-CM

## 2015-07-06 LAB — HEPATIC FUNCTION PANEL
ALK PHOS: 88 U/L (ref 39–117)
ALT: 78 U/L — ABNORMAL HIGH (ref 0–53)
AST: 39 U/L — AB (ref 0–37)
Albumin: 4.7 g/dL (ref 3.5–5.2)
BILIRUBIN TOTAL: 0.5 mg/dL (ref 0.2–1.2)
Bilirubin, Direct: 0.1 mg/dL (ref 0.0–0.3)
Total Protein: 7.8 g/dL (ref 6.0–8.3)

## 2015-07-06 LAB — LIPID PANEL
Cholesterol: 271 mg/dL — ABNORMAL HIGH (ref 0–200)
HDL: 43.7 mg/dL (ref >39.00)
NONHDL: 227.3
TRIGLYCERIDES: 306 mg/dL — AB (ref 0.0–149.0)
Total CHOL/HDL Ratio: 6
VLDL: 61.2 mg/dL — ABNORMAL HIGH (ref 0.0–40.0)

## 2015-07-06 LAB — LDL CHOLESTEROL, DIRECT: Direct LDL: 184 mg/dL

## 2015-07-06 MED ORDER — MELOXICAM 15 MG PO TABS: 15.0000 mg | ORAL_TABLET | Freq: Every day | ORAL | Status: DC

## 2015-07-06 MED ORDER — OXYCODONE HCL ER 10 MG PO T12A: 10.0000 mg | EXTENDED_RELEASE_TABLET | Freq: Two times a day (BID) | ORAL | Status: DC

## 2015-07-06 NOTE — Patient Instructions (Addendum)
Your blood pressure is much better without the "energy" drinks!    continue using the tramadol and ibuprofen during the day, and the oxycodone if needed at night    have refilled the oxycodone for 60 days .  ( maximum one tablet daily  )  You will need  to sign a narcotic contract in order to continue receiving controlled substances through our office.  This is a simple document that outlines our agreement and what we expect of you   I will see you again in 6 months,  Sooner if needed
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
## 2015-07-06 NOTE — Progress Notes (Signed)
Pre visit review using our clinic review tool, if applicable. No additional management support is needed unless otherwise documented below in the visit note. 

## 2015-07-07 ENCOUNTER — Telehealth: Payer: Self-pay | Admitting: Internal Medicine

## 2015-07-07 DIAGNOSIS — G8929 Other chronic pain: Secondary | ICD-10-CM

## 2015-07-07 MED ORDER — OXYCODONE-ACETAMINOPHEN 5-325 MG PO TABS: ORAL_TABLET | ORAL | Status: DC

## 2015-07-07 NOTE — Telephone Encounter (Signed)
Generic percocet written.  Can pick up this afternoon

## 2015-07-07 NOTE — Assessment & Plan Note (Signed)
Secondary to family and financial stressors.  He does not require medication and has prior intolerance of zanax and Klonipin due to excessive sedation.

## 2015-07-07 NOTE — Assessment & Plan Note (Signed)
Improved with dietary changes.  No meds at this point,  Return in 6 months

## 2015-07-07 NOTE — Telephone Encounter (Signed)
Pt came in this morning stating that he went to the pharmacy to fill the oxyCODONE (OXYCONTIN) 10 mg 12 hr tablet and it was a $180.Marland Kitchen Pt wanted to know if there was and generic for this.. Please advise pt..  I have the Rx paper.Marland Kitchen

## 2015-07-07 NOTE — Telephone Encounter (Signed)
Pt is requesting something cheaper for pain the oxycodone is too expensive, I have the script and will give it to [place]. Please advise, thanks

## 2015-07-07 NOTE — Assessment & Plan Note (Signed)
Review of online records indicate a ER visit for shoulder pian several years ago,  No bony damage.  Continue tramadol for daytime pain ,  Limit percocet to 30/month,  Narcotics contract signed.

## 2015-07-07 NOTE — Telephone Encounter (Signed)
Pt states he will be in around 1:00pm today to p/u Rx

## 2015-07-07 NOTE — Telephone Encounter (Signed)
Patient came to get prescription.  He wanted to clarify why the 1st prescription was not able to be filled.  He was originally prescribed the extended release, which was the high cost one, if it had been the regular oxycontin he would have not had an issue.  He will try the oxycodone (Roxicet) and see if it helps.  He will let the office know if it doesn't help.  Thanks

## 2015-07-08 DIAGNOSIS — R748 Abnormal levels of other serum enzymes: Secondary | ICD-10-CM | POA: Insufficient documentation

## 2015-07-08 NOTE — Addendum Note (Signed)
Addended by: Sherlene Shams on: 07/08/2015 01:25 PM   Modules accepted: Orders

## 2015-07-13 NOTE — Telephone Encounter (Signed)
No I will not change the rx again for the following reasons:.This medication IS THE non extended Oycontin.  He is supposed to be using meloxicam and tramadol daily and saving the roxicet for severe pain at night.    if he needs to use a narcotic on a daily basis,  Which is different than what he stated during his visit,.  He will need to be referred to Pain Clinic

## 2015-07-13 NOTE — Addendum Note (Signed)
Addended by: Sherlene Shams on: 07/13/2015 07:21 PM   Modules accepted: Orders

## 2015-07-13 NOTE — Telephone Encounter (Signed)
Mr. Hanshaw, called the office this morning.  He has been trying the Roxicet for a week now, it doesn't seem to be helping.  He knows that he is going to be sore due to his line of work, but  Thursday and Friday were horrible.  After work on Thursday at 730pm took a Roxicet and then ended up taking a tramadol and a meloxicam at 9pm and finally fell asleep.  His work is short staffed so he is working with limited breaks, which he knows will increase his discomfort.  He is requesting to see if the original prescription can be ordered for the oxycontin NON extended release?  Please advise?

## 2015-07-13 NOTE — Telephone Encounter (Signed)
Spoke with the patient.  He is agreement for a referral to the pain clinic.  He is concerned that he doesn't have insurance and how much it would cost.  Please advise?

## 2015-07-13 NOTE — Telephone Encounter (Signed)
I will place the referral and the Pain clinic will talk to him about the cost.

## 2015-07-17 ENCOUNTER — Ambulatory Visit: Payer: Medicaid Other | Admitting: Internal Medicine

## 2015-07-22 ENCOUNTER — Encounter: Payer: Self-pay | Admitting: Internal Medicine

## 2015-08-04 ENCOUNTER — Telehealth: Payer: Self-pay | Admitting: *Deleted

## 2015-08-04 ENCOUNTER — Other Ambulatory Visit: Payer: Medicaid Other

## 2015-08-04 NOTE — Telephone Encounter (Signed)
LM FOR THE PT ON MRS. Stanfill VM DUE TO THE NUMBERS LISTED FOR HIM IS OUT OF ORDER. i ASKED MRS. Cosma TO PLEASE HAVE HIM TO CALL AND SCHEDULE AN APPT.Marland Kitchen.Marland Kitchen.TD

## 2015-08-26 ENCOUNTER — Ambulatory Visit: Payer: Medicaid Other | Attending: Anesthesiology | Admitting: Anesthesiology

## 2015-08-26 ENCOUNTER — Encounter: Payer: Self-pay | Admitting: Anesthesiology

## 2015-08-26 VITALS — BP 140/95 | HR 88 | Temp 98.8°F | Resp 16 | Ht 66.0 in | Wt 180.0 lb

## 2015-08-26 DIAGNOSIS — M19071 Primary osteoarthritis, right ankle and foot: Secondary | ICD-10-CM | POA: Diagnosis not present

## 2015-08-26 DIAGNOSIS — M179 Osteoarthritis of knee, unspecified: Secondary | ICD-10-CM | POA: Insufficient documentation

## 2015-08-26 DIAGNOSIS — R51 Headache: Secondary | ICD-10-CM | POA: Diagnosis not present

## 2015-08-26 DIAGNOSIS — M25562 Pain in left knee: Secondary | ICD-10-CM | POA: Insufficient documentation

## 2015-08-26 DIAGNOSIS — G8929 Other chronic pain: Secondary | ICD-10-CM

## 2015-08-26 DIAGNOSIS — M25571 Pain in right ankle and joints of right foot: Secondary | ICD-10-CM | POA: Insufficient documentation

## 2015-08-26 DIAGNOSIS — M1710 Unilateral primary osteoarthritis, unspecified knee: Secondary | ICD-10-CM | POA: Insufficient documentation

## 2015-08-26 DIAGNOSIS — R52 Pain, unspecified: Secondary | ICD-10-CM | POA: Diagnosis present

## 2015-08-26 DIAGNOSIS — M1712 Unilateral primary osteoarthritis, left knee: Secondary | ICD-10-CM | POA: Diagnosis not present

## 2015-08-26 MED ORDER — CELECOXIB 200 MG PO CAPS: 200.0000 mg | ORAL_CAPSULE | Freq: Two times a day (BID) | ORAL | Status: DC

## 2015-08-26 NOTE — Patient Instructions (Signed)
Lateral Femoral Cutaneous Nerve Block Patient Information  Description: The lateral femoral cutaneous nerve of the thigh is a purely sensory nerve that can become entrapped or irritated for a number of reasons.  The pain associated with this condition is called meralgia paraesthetica.  Patients affected with this syndrome have burning pain or abnormal sensation along the lateral aspect of the thigh.  The pain can be worsened by prolonged walking, standing, or constrictive garments around the house.   The diagnosis can be confirmed and treatment initiated by blocking the nerve with local anesthetic (like Novocaine).  At times, a steroid solution may be injected at the same time.  The site of injection is through a tiny needle in the left, lower quadrant of the abdomen.   The entire block usually lasts less than 5 minutes.  Conditions which may be treated by lateral femoral cutaneous nerve block:   Meralagia paraesthetica  Preparation for the injection:  1. Do not eat any solid food or dairy products within 8 hours of your appointment.  2. You may drink clear liquids up to 3 hours before appointment.  Clear liquids include water, black coffee, juice or soda. No milk or cream please. 3. You may take your regular medication, including pain medications, with a sip of water before your appointment.  Diabetics should hold regular insulin (if taken separately) and take 1/2 normal NPH dose the morning of the procedure.  Carry some sugar containing items with you to your appointment. 4. A driver must accompany you and be prepared to drive you home after your procedure. 5. Bring all you current medications with you 6. An IV may be inserted and sedation may be given at the discretion of the physician. 7. A blood pressure cuff, EKG and other monitors will often be applied during the procedure.  Some patients may need to have extra oxygen administered for a short period. 8. You will be asked to provide medical  information, including your allergies and medications, prior to the procedure.  We must know immediately if you are taking blood thinners (like Coumadin/Warfarin) or if you allergic to IV iodine contrast (dye)  We must know if you could possible be pregnant.   Possible side-effects:   Bleeding from needle site  Infection (rate, may require surgery)  Nerve injury (rare)  Numbness and Tingling (temporary)  Light-headedness (temporary)  Pain at injection site (several day)  Decreased blood pressure (rare, temporary)  Weakness in leg (temporary)  Call if you experience:  Hives or difficulty breathing (go to the emergency room)  Inflammation or drainage at the injection site(s)  Please note:  Although the local anesthetic injected can often make your leg feel good for several hours after the injection,  The pain may return.  It takes 3-7 days for steroids to work.  You may not notice any pain relief for at least one week.  If effective, we will often do a series of injections spaced 3-6 weeks apart to maximally decrease your pain.  If you have any questions, please cll [phone] [facility] A prescription for Celebrex was sent to your pharmacy.

## 2015-08-27 NOTE — Progress Notes (Signed)
Subjective:    Patient ID: Kyle MandrilMatthew D Marshall, male    DOB: [birth year], 25 y.o.   MRN: [id number]  HPI  Vision is a pleasant delightful 25 year old gentleman who comes in with 2 kinds of pain Family pain is pain in his left knee which she's had for 2 years and it followed multiple sports related injuries Describes that pain as dull and deep but constant in nature His secondary pain is pain in the right ankle which has been present for the past 8 years and it follows a fracture of his right ankle  Pain intensity rating His subjective pain intensity rating is 70% His pain is decreased by hot Epsom salts baths and her Roxicet His pain is aggravated by taking his shoes off  Pain medications Patient indicates that he takes Roxicet 5/325 mg tramadol meloxicam and lots of Tylenol  Other medications Other medications include albuterol BC headache powder Nexium ibuprofen and Claritin  Allergies There are no known allergies  Past medical history As medical history is positive for inner ear infection as a child and had to have tubes put in He Is also been diagnosed as having a heart murmur  Past surgical history Past surgical history is positive for right shoulder surgery and inner ear  Surgery  Social and economic history Patient does not smoke he drinks a glass of wine about once or twice a month He has smoked marijuana and he last smoked. 5 months ago. He does not use any other illicit drugs He is currently working as a Pensions consultanttechnician He has been married for 5 years and has one child age 97 years  Family history   patient's mother is alive at age 25 but has breast cancer His father is deceased at age 25 from the complications of metastatic lung disease He has one brother who is alive and well at age 25 He has one sister who is alive and well at age 25    Review of Systems  Constitutional: Negative.  Negative for fever, chills, diaphoresis, activity change, appetite change, fatigue  and unexpected weight change.  HENT: Negative.   Eyes: Negative.   Respiratory: Negative for apnea, cough, choking, chest tightness, shortness of breath, wheezing and stridor.   Cardiovascular: Negative.  Negative for chest pain, palpitations and leg swelling.       Patient has a history of having had a heart murmur  Gastrointestinal: Negative.   Endocrine: Negative.   Genitourinary: Negative.   Musculoskeletal: Positive for joint swelling, arthralgias and gait problem. Negative for myalgias, back pain, neck pain and neck stiffness.  Skin: Negative.   Allergic/Immunologic: Negative.   Hematological: Negative.   Psychiatric/Behavioral: Negative.        Objective:   Physical Exam  Constitutional: He is oriented to person, place, and time. He appears well-developed and well-nourished. No distress.  HENT:  Head: Normocephalic and atraumatic.  Right Ear: External ear normal.  Left Ear: External ear normal.  Nose: Nose normal.  Mouth/Throat: No oropharyngeal exudate.  Eyes: Conjunctivae and EOM are normal. Right eye exhibits no discharge. Left eye exhibits no discharge. No scleral icterus.  Neck: Normal range of motion. Neck supple. No JVD present. No tracheal deviation present. No thyromegaly present.  Cardiovascular: Normal rate, regular rhythm, normal heart sounds and intact distal pulses.  Exam reveals no gallop and no friction rub.   No murmur heard. He appears to be in no distress  His blood pressure is 140/95 mmHg His pulse is 88 bpm  Equal and regular Respirations are 16 breaths per minute Temperature is 98.33F SPO2 was 100%  Pulmonary/Chest: Effort normal and breath sounds normal. No respiratory distress. He has no wheezes. He has no rales. He exhibits no tenderness.  Abdominal: Soft. Bowel sounds are normal. He exhibits no distension and no mass. There is no tenderness. There is no rebound and no guarding.  Genitourinary:  Genitourinary examination was deferred   Musculoskeletal: He exhibits no edema.  Patient's left knee was slightly swollen and there was crepitus on flexion Not decreased range of motion in the left knee   Lymphadenopathy:    He has no cervical adenopathy.  Neurological: He is alert and oriented to person, place, and time. He has normal reflexes. He displays normal reflexes. No cranial nerve deficit. He exhibits normal muscle tone. Coordination normal.  Skin: Skin is warm and dry. No rash noted. He is not diaphoretic. No erythema. No pallor.  Psychiatric: He has a normal mood and affect. His behavior is normal. Judgment and thought content normal.  Nursing note and vitals reviewed.         Assessment & Plan:  Assessment 1 pain in the left knee  2 osteoarthritis of the left knee 3 pain in the right ankle 4 osteoarthritis of the right ankle    Plan of management 1 for left femoral nerve block 2 Celebrex 200 mg twice a day and will give him 30 tablets with one refill 3 discontinue all other analgesic medication 4 Will the procedure for this patient in the next 2 days   New patient      Level  4  Eliska Hamil M.D.

## 2015-09-02 LAB — TOXASSURE SELECT 13 (MW), URINE: PDF: 0

## 2015-09-16 ENCOUNTER — Encounter: Payer: Self-pay | Admitting: Medical Oncology

## 2015-09-16 ENCOUNTER — Emergency Department
Admission: EM | Admit: 2015-09-16 | Discharge: 2015-09-16 | Disposition: A | Discharge status: Home / Self Care | Payer: Medicaid Other | Attending: Emergency Medicine | Admitting: Emergency Medicine

## 2015-09-16 DIAGNOSIS — J45909 Unspecified asthma, uncomplicated: Secondary | ICD-10-CM | POA: Insufficient documentation

## 2015-09-16 DIAGNOSIS — Z79899 Other long term (current) drug therapy: Secondary | ICD-10-CM | POA: Insufficient documentation

## 2015-09-16 DIAGNOSIS — K0889 Other specified disorders of teeth and supporting structures: Secondary | ICD-10-CM | POA: Diagnosis present

## 2015-09-16 DIAGNOSIS — F1290 Cannabis use, unspecified, uncomplicated: Secondary | ICD-10-CM | POA: Insufficient documentation

## 2015-09-16 DIAGNOSIS — Z791 Long term (current) use of non-steroidal anti-inflammatories (NSAID): Secondary | ICD-10-CM | POA: Insufficient documentation

## 2015-09-16 DIAGNOSIS — M179 Osteoarthritis of knee, unspecified: Secondary | ICD-10-CM | POA: Insufficient documentation

## 2015-09-16 DIAGNOSIS — K047 Periapical abscess without sinus: Secondary | ICD-10-CM | POA: Diagnosis not present

## 2015-09-16 DIAGNOSIS — F1110 Opioid abuse, uncomplicated: Secondary | ICD-10-CM | POA: Diagnosis not present

## 2015-09-16 DIAGNOSIS — Z7982 Long term (current) use of aspirin: Secondary | ICD-10-CM | POA: Insufficient documentation

## 2015-09-16 MED ORDER — AMOXICILLIN 500 MG PO TABS: 500.0000 mg | ORAL_TABLET | Freq: Three times a day (TID) | ORAL | Status: DC

## 2015-09-16 MED ORDER — OXYCODONE-ACETAMINOPHEN 5-325 MG PO TABS: 1.0000 | ORAL_TABLET | Freq: Four times a day (QID) | ORAL | Status: DC | PRN

## 2015-09-16 NOTE — ED Notes (Signed)
Rt lower dental pain/swelling x 3 days.

## 2015-09-16 NOTE — ED Provider Notes (Signed)
[facility] Emergency Department Provider Note ____________________________________________  Time seen: Approximately 8:18 AM  I have reviewed the triage vital signs and the nursing notes.   HISTORY  Chief Complaint Dental Pain   HPI Kyle Marshall is a 25 y.o. male who presents to the emergency department for evaluation of facial swelling and dental pain. Pain started 3 days ago. He awakened yesterday with mild right lower jaw swelling and it is worse this morning. No OTC medication or tramadol has relieved the pain. He has a dental appointment next Tuesday, but is unable to tolerate the pain any longer. He has a history of "bad teeth."  Past Medical History  Diagnosis Date  . Asthma   . Abdominal pain 02/12/2008  . Difficulty hearing 02/09/2015  . Allergic rhinitis 12/08/2014    Patient Active Problem List   Diagnosis Date Noted  . Knee pain, left 08/26/2015  . OA (osteoarthritis) of knee 08/26/2015  . Elevated liver enzymes 07/08/2015  . Elevated blood pressure reading without diagnosis of hypertension 06/06/2015  . Generalized anxiety disorder 06/06/2015  . Generalized OA 06/06/2015  . History of syncope 06/06/2015  . Asthma 12/08/2014  . Allergic rhinitis 12/08/2014  . Abdominal pain 02/12/2008    Past Surgical History  Procedure Laterality Date  . Shoulder surgery Right 2012  . Ear tube removal      Current Outpatient Rx  Name  Route  Sig  Dispense  Refill  . albuterol (PROVENTIL HFA;VENTOLIN HFA) 108 (90 BASE) MCG/ACT inhaler   Inhalation   Inhale 2 puffs into the lungs every 4 (four) hours as needed for wheezing or shortness of breath. Reported on 08/26/2015         . amoxicillin (AMOXIL) 500 MG tablet   Oral   Take 1 tablet (500 mg total) by mouth 3 (three) times daily.   30 tablet   0   . Aspirin-Salicylamide-Caffeine (BC HEADACHE POWDER PO)   Oral   Take by mouth as needed. Reported on 08/26/2015         . celecoxib  (CELEBREX) 200 MG capsule   Oral   Take 1 capsule (200 mg total) by mouth 2 (two) times daily after a meal.   42 capsule   1   . esomeprazole (NEXIUM) 40 MG capsule   Oral   Take 40 mg by mouth daily at 12 noon. Reported on 08/26/2015         . Ibuprofen (EQ IBUPROFEN) 200 MG CAPS   Oral   Take 4 capsules (800 mg total) by mouth 3 (three) times daily as needed. Patient not taking: Reported on 08/26/2015   1 each   0   . loratadine (CLARITIN) 10 MG tablet   Oral   Take 10 mg by mouth daily. Reported on 08/26/2015         . meloxicam (MOBIC) 15 MG tablet   Oral   Take 1 tablet (15 mg total) by mouth daily. Patient not taking: Reported on 08/26/2015   90 tablet   2   . oxyCODONE-acetaminophen (ROXICET) 5-325 MG tablet   Oral   Take 1 tablet by mouth every 6 (six) hours as needed.   9 tablet   0   . traMADol (ULTRAM) 50 MG tablet   Oral   Take 1 tablet (50 mg total) by mouth every 8 (eight) hours as needed. Patient not taking: Reported on 08/26/2015   120 tablet   2     Allergies Vicodin  Family History  Problem Relation Age of Onset  . Cancer Mother     breast  . Atrial fibrillation Mother   . Cancer Father     brain, lung     Social History Social History  Substance Use Topics  . Smoking status: Never Smoker   . Smokeless tobacco: Never Used  . Alcohol Use: 0.0 oz/week    0 Standard drinks or equivalent per week     Comment: seldom    Review of Systems Constitutional: No fever/chills ENT: No sore throat. Respiratory: No cough Gastrointestinal: No nausea, no vomiting.  Skin: Positive for swelling ____________________________________________   PHYSICAL EXAM:  VITAL SIGNS: ED Triage Vitals  Enc Vitals Group     BP 09/16/15 0759 163/98 mmHg     Pulse Rate 09/16/15 0759 99     Resp 09/16/15 0759 18     Temp 09/16/15 0759 98.2 F (36.8 C)     Temp Source 09/16/15 0759 Oral     SpO2 09/16/15 0759 100 %     Weight 09/16/15 0759 180 lb (81.647 kg)       Height 09/16/15 0759  (1.676 m)     Head Cir --      Peak Flow --      Pain Score 09/16/15 0800 10     Pain Loc --      Pain Edu? --      Excl. in GC? --     Constitutional: Alert and oriented. Well appearing and in no acute distress. Eyes: Conjunctivae are normal. PERRL. EOMI. Head: Atraumatic. Nose: No congestion/rhinnorhea. Mouth/Throat: Mucous membranes are moist.  Oropharynx non-erythematous. Periodontal Exam    Neck: No stridor.  Hematological/Lymphatic/Immunilogical: No cervical lymphadenopathy. Cardiovascular: Good peripheral circulation. Respiratory: Normal respiratory effort.  No retractions. Musculoskeletal: Full ROM x 4 extremities. Neurologic:  Normal speech and language. No gross focal neurologic deficits are appreciated. Speech is normal. No gait instability. Skin:  Skin is warm, dry and intact. Psychiatric: Mood and affect are normal. Speech and behavior are normal.  ____________________________________________   LABS (all labs ordered are listed, but only abnormal results are displayed)  Labs Reviewed - No data to display ____________________________________________   RADIOLOGY  Not applicable. ____________________________________________   PROCEDURES  Procedure(s) performed: None  Critical Care performed: No  ____________________________________________   INITIAL IMPRESSION / ASSESSMENT AND PLAN / ED COURSE  Pertinent labs & imaging results that were available during my care of the patient were reviewed by me and considered in my medical decision making (see chart for details).  Patient was advised to see the dentist within 14 days. Also advised to take the amoxicillin until finished. Instructed to return to the ER for symptoms that change or worsen if unable to keep his scheduled appointment. ____________________________________________   FINAL CLINICAL IMPRESSION(S) / ED DIAGNOSES  Final diagnoses:  Dental abscess        Chinita Pester, FNP 09/16/15 4098  Arnaldo Natal, MD 09/16/15 1340

## 2015-09-16 NOTE — ED Notes (Signed)
States he developed right lower gum pain .Marland Kitchen.states he has a broken tooth on same side  Right side of face swelling ..has appt next week at dentist

## 2015-09-16 NOTE — Discharge Instructions (Signed)

## 2015-09-25 ENCOUNTER — Ambulatory Visit: Payer: Medicaid Other | Attending: Anesthesiology | Admitting: Anesthesiology

## 2015-09-25 ENCOUNTER — Encounter: Payer: Self-pay | Admitting: Anesthesiology

## 2015-09-25 VITALS — BP 155/97 | HR 98 | Temp 98.6°F | Resp 18 | Ht 66.0 in | Wt 180.0 lb

## 2015-09-25 DIAGNOSIS — M25562 Pain in left knee: Secondary | ICD-10-CM | POA: Insufficient documentation

## 2015-09-25 DIAGNOSIS — G8929 Other chronic pain: Secondary | ICD-10-CM | POA: Insufficient documentation

## 2015-09-25 DIAGNOSIS — M1712 Unilateral primary osteoarthritis, left knee: Secondary | ICD-10-CM | POA: Insufficient documentation

## 2015-09-25 DIAGNOSIS — M1732 Unilateral post-traumatic osteoarthritis, left knee: Secondary | ICD-10-CM

## 2015-09-25 MED ORDER — BUPIVACAINE HCL (PF) 0.5 % IJ SOLN
INTRAMUSCULAR | Status: DC
Start: 2015-09-25 — End: 2015-09-25
  Filled 2015-09-25: qty 30

## 2015-09-25 MED ORDER — TRIAMCINOLONE ACETONIDE 40 MG/ML IJ SUSP
INTRAMUSCULAR | Status: AC
  Filled 2015-09-25: qty 1

## 2015-09-25 NOTE — Progress Notes (Signed)
Safety precautions to be maintained throughout the outpatient stay will include: orient to surroundings, keep bed in low position, maintain call bell within reach at all times, provide assistance with transfer out of bed and ambulation. [id number] pateint has decided not to have procedure and wants to reschedule for another date. Dr. Starling MannsParris aware and spoke with patient. Will schedule for next week.

## 2015-09-25 NOTE — Patient Instructions (Signed)
Lateral Femoral Cutaneous Nerve Block Patient Information  Description: The lateral femoral cutaneous nerve of the thigh is a purely sensory nerve that can become entrapped or irritated for a number of reasons.  The pain associated with this condition is called meralgia paraesthetica.  Patients affected with this syndrome have burning pain or abnormal sensation along the lateral aspect of the thigh.  The pain can be worsened by prolonged walking, standing, or constrictive garments around the house.   The diagnosis can be confirmed and treatment initiated by blocking the nerve with local anesthetic (like Novocaine).  At times, a steroid solution may be injected at the same time.  The site of injection is through a tiny needle in the left, lower quadrant of the abdomen.   The entire block usually lasts less than 5 minutes.  Conditions which may be treated by lateral femoral cutaneous nerve block:   Meralagia paraesthetica  Preparation for the injection:  1. Do not eat any solid food or dairy products within 8 hours of your appointment.  2. You may drink clear liquids up to 3 hours before appointment.  Clear liquids include water, black coffee, juice or soda. No milk or cream please. 3. You may take your regular medication, including pain medications, with a sip of water before your appointment.  Diabetics should hold regular insulin (if taken separately) and take 1/2 normal NPH dose the morning of the procedure.  Carry some sugar containing items with you to your appointment. 4. A driver must accompany you and be prepared to drive you home after your procedure. 5. Bring all you current medications with you 6. An IV may be inserted and sedation may be given at the discretion of the physician. 7. A blood pressure cuff, EKG and other monitors will often be applied during the procedure.  Some patients may need to have extra oxygen administered for a short period. 8. You will be asked to provide medical  information, including your allergies and medications, prior to the procedure.  We must know immediately if you are taking blood thinners (like Coumadin/Warfarin) or if you allergic to IV iodine contrast (dye)  We must know if you could possible be pregnant.   Possible side-effects:   Bleeding from needle site  Infection (rate, may require surgery)  Nerve injury (rare)  Numbness and Tingling (temporary)  Light-headedness (temporary)  Pain at injection site (several day)  Decreased blood pressure (rare, temporary)  Weakness in leg (temporary)  Call if you experience:  Hives or difficulty breathing (go to the emergency room)  Inflammation or drainage at the injection site(s)  Please note:  Although the local anesthetic injected can often make your leg feel good for several hours after the injection,  The pain may return.  It takes 3-7 days for steroids to work.  You may not notice any pain relief for at least one week.  If effective, we will often do a series of injections spaced 3-6 weeks apart to maximally decrease your pain.  If you have any questions, please cll [phone] [facility]  Pain Management Discharge Instructions  General Discharge Instructions :  If you need to reach your doctor call: Monday-Friday 8:00 am - 4:00 pm at [phone] or toll free [phone].  After clinic hours [phone] to have operator reach doctor.  Bring all of your medication bottles to all your appointments in the pain clinic.  To cancel or reschedule your appointment with Pain Management please remember to   call 24 hours in advance to avoid a fee.  Refer to the educational materials which you have been given on: General Risks, I had my Procedure. Discharge Instructions, Post Sedation.  Post Procedure Instructions:  The drugs you were given will stay in your system until tomorrow, so for the next 24 hours you should not drive, make any  legal decisions or drink any alcoholic beverages.  You may eat anything you prefer, but it is better to start with liquids then soups and crackers, and gradually work up to solid foods.  Please notify your doctor immediately if you have any unusual bleeding, trouble breathing or pain that is not related to your normal pain.  Depending on the type of procedure that was done, some parts of your body may feel week and/or numb.  This usually clears up by tonight or the next day.  Walk with the use of an assistive device or accompanied by an adult for the 24 hours.  You may use ice on the affected area for the first 24 hours.  Put ice in a Ziploc bag and cover with a towel and place against area 15 minutes on 15 minutes off.  You may switch to heat after 24 hours. 

## 2015-09-27 NOTE — Progress Notes (Signed)
   Subjective:    Patient ID: Kyle Marshall, male    DOB: 1990/11/17, 25 y.o.   MRN: [id number]  HPI  This 25 year old man was seen by me approximately 3 weeks ago  For the management of chronic left knee pain secondary to trauma He had advanced osteoarthritis of the left knee and as a consequence he was schedule femoral nerve block After obtaining informed consent he was taken to the procedure room for th left femoral nerve block While waiting he got an anxiety attack and asked to reschedule I explained to him that the left femoralnerve block   That his apprehension continued. I therefore decided to not try to pursue it hadn't rescheduled the case for the next week or 2  Review of Systems  Constitutional: Negative.   HENT: Negative.   Eyes: Negative.   Respiratory: Negative.   Endocrine: Negative.   Genitourinary: Negative.   Musculoskeletal: Positive for joint swelling and arthralgias.       Patient had severe pain in his left knee following traumatic osteoarthritis of the left knee  Allergic/Immunologic: Negative.   Neurological: Negative.   Hematological: Negative.   Psychiatric/Behavioral: Negative.        Objective:   Physical Exam  Cardiovascular:  Physical examination was unremarkable And bed in no distress but he was somewhat anxious Vital signs were stable There was tenderness and decreased range and flexion of the left knee revealed significant crepitus   Nursing note and vitals reviewed.         Assessment & Plan:   Assessment 1 chronic left knee pain 2 severe osteoarthritis of the left knee   Plan of management After spending approximately 20 minut trying to discuss the procedure with the patient, decided to postpone the procedure and planets Schedule him again for a left femoral nerve block without intravenous sedation This procedure would be performed using the elicitation of paresthesias down the left thigh    Established patient     Level  3    Tod PersiaWinston Lucielle Vokes M.D.

## 2015-09-28 ENCOUNTER — Telehealth: Payer: Self-pay

## 2015-09-28 NOTE — Telephone Encounter (Signed)
Post procedure phone call.  States he is doing good.  

## 2015-10-02 ENCOUNTER — Encounter: Payer: Self-pay | Admitting: Anesthesiology

## 2015-10-02 ENCOUNTER — Ambulatory Visit: Payer: Medicaid Other | Admitting: Anesthesiology

## 2015-10-02 ENCOUNTER — Ambulatory Visit: Payer: Medicaid Other | Attending: Anesthesiology | Admitting: Anesthesiology

## 2015-10-02 VITALS — BP 152/86 | HR 81 | Temp 98.4°F | Resp 16 | Ht 66.0 in | Wt 180.0 lb

## 2015-10-02 DIAGNOSIS — M1712 Unilateral primary osteoarthritis, left knee: Secondary | ICD-10-CM | POA: Diagnosis not present

## 2015-10-02 DIAGNOSIS — M25562 Pain in left knee: Secondary | ICD-10-CM | POA: Diagnosis present

## 2015-10-02 DIAGNOSIS — G8929 Other chronic pain: Secondary | ICD-10-CM | POA: Insufficient documentation

## 2015-10-02 MED ORDER — BUPIVACAINE HCL (PF) 0.5 % IJ SOLN: 10.0000 mL | Freq: Once | INTRAMUSCULAR | Status: AC

## 2015-10-02 MED ORDER — BUPIVACAINE HCL (PF) 0.5 % IJ SOLN
INTRAMUSCULAR | Status: AC
  Administered 2015-10-02: 09:00:00
  Filled 2015-10-02: qty 30

## 2015-10-02 MED ORDER — TRIAMCINOLONE ACETONIDE 40 MG/ML IJ SUSP
INTRAMUSCULAR | Status: AC
  Administered 2015-10-02: 09:00:00
  Filled 2015-10-02: qty 1

## 2015-10-02 NOTE — Patient Instructions (Signed)
Lateral Femoral Cutaneous Nerve Block Patient Information  Description: The lateral femoral cutaneous nerve of the thigh is a purely sensory nerve that can become entrapped or irritated for a number of reasons.  The pain associated with this condition is called meralgia paraesthetica.  Patients affected with this syndrome have burning pain or abnormal sensation along the lateral aspect of the thigh.  The pain can be worsened by prolonged walking, standing, or constrictive garments around the house.   The diagnosis can be confirmed and treatment initiated by blocking the nerve with local anesthetic (like Novocaine).  At times, a steroid solution may be injected at the same time.  The site of injection is through a tiny needle in the left, lower quadrant of the abdomen.   The entire block usually lasts less than 5 minutes.  Conditions which may be treated by lateral femoral cutaneous nerve block:   Meralagia paraesthetica  Preparation for the injection:  1. Do not eat any solid food or dairy products within 8 hours of your appointment.  2. You may drink clear liquids up to 3 hours before appointment.  Clear liquids include water, black coffee, juice or soda. No milk or cream please. 3. You may take your regular medication, including pain medications, with a sip of water before your appointment.  Diabetics should hold regular insulin (if taken separately) and take 1/2 normal NPH dose the morning of the procedure.  Carry some sugar containing items with you to your appointment. 4. A driver must accompany you and be prepared to drive you home after your procedure. 5. Bring all you current medications with you 6. An IV may be inserted and sedation may be given at the discretion of the physician. 7. A blood pressure cuff, EKG and other monitors will often be applied during the procedure.  Some patients may need to have extra oxygen administered for a short period. 8. You will be asked to provide medical  information, including your allergies and medications, prior to the procedure.  We must know immediately if you are taking blood thinners (like Coumadin/Warfarin) or if you allergic to IV iodine contrast (dye)  We must know if you could possible be pregnant.   Possible side-effects:   Bleeding from needle site  Infection (rate, may require surgery)  Nerve injury (rare)  Numbness and Tingling (temporary)  Light-headedness (temporary)  Pain at injection site (several day)  Decreased blood pressure (rare, temporary)  Weakness in leg (temporary)  Call if you experience:  Hives or difficulty breathing (go to the emergency room)  Inflammation or drainage at the injection site(s)  Please note:  Although the local anesthetic injected can often make your leg feel good for several hours after the injection,  The pain may return.  It takes 3-7 days for steroids to work.  You may not notice any pain relief for at least one week.  If effective, we will often do a series of injections spaced 3-6 weeks apart to maximally decrease your pain.  If you have any questions, please cll [phone] [facility]  Pain Management Discharge Instructions  General Discharge Instructions :  If you need to reach your doctor call: Monday-Friday 8:00 am - 4:00 pm at [phone] or toll free [phone].  After clinic hours [phone] to have operator reach doctor.  Bring all of your medication bottles to all your appointments in the pain clinic.  To cancel or reschedule your appointment with Pain Management please remember to  call 24 hours in advance to avoid a fee.  Refer to the educational materials which you have been given on: General Risks, I had my Procedure. Discharge Instructions, Post Sedation.  Post Procedure Instructions:  The drugs you were given will stay in your system until tomorrow, so for the next 24 hours you should not drive, make any  legal decisions or drink any alcoholic beverages.  You may eat anything you prefer, but it is better to start with liquids then soups and crackers, and gradually work up to solid foods.  Please notify your doctor immediately if you have any unusual bleeding, trouble breathing or pain that is not related to your normal pain.  Depending on the type of procedure that was done, some parts of your body may feel week and/or numb.  This usually clears up by tonight or the next day.  Walk with the use of an assistive device or accompanied by an adult for the 24 hours.  You may use ice on the affected area for the first 24 hours.  Put ice in a Ziploc bag and cover with a towel and place against area 15 minutes on 15 minutes off.  You may switch to heat after 24 hours. 

## 2015-10-02 NOTE — Progress Notes (Signed)
Safety precautions to be maintained throughout the outpatient stay will include: orient to surroundings, keep bed in low position, maintain call bell within reach at all times, provide assistance with transfer out of bed and ambulation.  

## 2015-10-02 NOTE — Procedures (Signed)
Date of procedure:  10/02/2015  Preoperative Diagnosis:  1 Chronic L Knee pain 2. Osteoarthritis of L knee  Postoperative Diagnosis:  Same.  Procedure: 1. left Femoral Nerve Block.  Surgeon: Tod PersiaWinston Arden Axon, MD  Anesthesia: No intravenous sedation was needed nor given. Informed consent was obtained and the patient appeared to accept and understand the benefits and risks of this procedure.   Pre procedure comments:  None  Description of the Procedure:  The patient was taken to the operating room and placed in the prone position.   The left groin and the leftmedial aspect of the upper thigh was prepped with Betadine.  left Femoral Nerve Block: After appropriate draping, the left femoral artery was palpated.   Then with the hand on the left femoral artery, a 1-inch, 22-gauge needle was inserted immediately lateral to the palpated left femoral artery and inserted perpendicularly into the groin just below the inguinal canal.  Paresthesia was elicited going down into the left thigh.   Aspirations were negative for blood and other body fluids.  Then 10 cc of 0.5% Bupivacaine and 40 mg Depomedrol were injected.   The needle was removed and adequate hemostasis was established.    Patient tolerated the procedures quite well.  Vital signs were stable.   There were no adverse effects.   The patient was taken to the recover room in satisfactory condition where patient was observed and subsequently discharged.   We will follow up with the patient in two weeks.   Tod PersiaWinston Peggy Monk MD

## 2015-10-05 ENCOUNTER — Telehealth: Payer: Self-pay | Admitting: *Deleted

## 2015-10-05 NOTE — Telephone Encounter (Signed)
Denies complications post procedure. 

## 2015-10-16 ENCOUNTER — Ambulatory Visit: Payer: Medicaid Other | Attending: Anesthesiology | Admitting: Anesthesiology

## 2015-10-16 ENCOUNTER — Encounter: Payer: Self-pay | Admitting: Anesthesiology

## 2015-10-16 VITALS — BP 140/92 | HR 106 | Temp 98.6°F | Resp 16 | Ht 66.0 in | Wt 180.0 lb

## 2015-10-16 DIAGNOSIS — G8929 Other chronic pain: Secondary | ICD-10-CM | POA: Diagnosis not present

## 2015-10-16 DIAGNOSIS — M1731 Unilateral post-traumatic osteoarthritis, right knee: Secondary | ICD-10-CM | POA: Insufficient documentation

## 2015-10-16 DIAGNOSIS — M25561 Pain in right knee: Secondary | ICD-10-CM | POA: Diagnosis present

## 2015-10-16 DIAGNOSIS — M25562 Pain in left knee: Secondary | ICD-10-CM

## 2015-10-16 DIAGNOSIS — M1732 Unilateral post-traumatic osteoarthritis, left knee: Secondary | ICD-10-CM

## 2015-10-16 MED ORDER — CELECOXIB 200 MG PO CAPS: 200.0000 mg | ORAL_CAPSULE | Freq: Two times a day (BID) | ORAL | Status: DC

## 2015-10-16 NOTE — Progress Notes (Signed)
Patient here for post procedure evaluation.  Patient was being treated for L knee pain.  Patient states procedure helped the day of but has experienced no pain relief.  Patient  Experienced complete numbness for approxmately 2 days. No numbness now but experiences a slight tingling down the inside of his leg from time to time.  Safety precautions to be maintained throughout the outpatient stay will include: orient to surroundings, keep bed in low position, maintain call bell within reach at all times, provide assistance with transfer out of bed and ambulation.

## 2015-10-18 NOTE — Progress Notes (Signed)
   Subjective:    Patient ID: Kyle Marshall, male    DOB: [birth year], 25 y.o.   MRN: [id number]  HPI  This patient return to the clinic today more than 2 days relief from the femoral nerve block He indicates that his subjective pain intensity rating is 55% The pain he said returned on the third day Nevertheless he is continuing to work and has not had to take any sick leave He appears to be in no distress The pain has not interfered very  Significantly with his activities of daily living  Review of Systems  Constitutional: Negative.   HENT: Negative.   Eyes: Negative.   Respiratory: Negative.   Cardiovascular: Negative.   Gastrointestinal: Negative.   Endocrine: Negative.   Genitourinary: Negative.   Musculoskeletal: Positive for myalgias, joint swelling and arthralgias. Negative for back pain, gait problem, neck pain and neck stiffness.       Patient has chronic right knee pain secondary to Posttraumatic osteoarthritis  Skin: Negative.   Allergic/Immunologic: Negative.   Neurological: Negative.   Hematological: Negative.   Psychiatric/Behavioral: Negative.        Objective:   Physical Exam  Cardiovascular:  The patient appears to be in no distress His subjective pain intensity rating is 55% His blood pressure is 140/92 mmHg His pulse is 106 bpm Equal and regular Heart sounds 1 and 2 were heard in al areas There were no audible murmurs Temperature is 98.36F Respirations are 16 breaths per min SPO2 is 100% Chest is clinically clear There are no adventitious sounds Abdomen is soft and nontender There is no palpable organomegaly There is no significant lymphadenopathy Pupils are equal and reactive Cranial nerves are intact There are no new neurological nor musculoskeletal findings  Nursing note and vitals reviewed.         Assessment & Plan:   Impression 1 chronic right knee pain 2 posttraumatic osteoarthritis   Plan of management We'll plan to give  the patient Celebrex 200 mg twice a day when necessary after meals # 42 tablets We'll follow up with him in 3 weeks   Established patient      Level III   Tod PersiaWinston Sarthak Rubenstein M.D.

## 2015-11-13 ENCOUNTER — Ambulatory Visit: Payer: Medicaid Other | Attending: Anesthesiology | Admitting: Anesthesiology

## 2015-11-13 ENCOUNTER — Encounter: Payer: Self-pay | Admitting: Anesthesiology

## 2015-11-13 VITALS — BP 141/65 | HR 87 | Temp 98.6°F | Resp 16 | Ht 66.0 in | Wt 180.0 lb

## 2015-11-13 DIAGNOSIS — M25562 Pain in left knee: Secondary | ICD-10-CM | POA: Diagnosis present

## 2015-11-13 DIAGNOSIS — G8929 Other chronic pain: Secondary | ICD-10-CM | POA: Insufficient documentation

## 2015-11-13 DIAGNOSIS — M1712 Unilateral primary osteoarthritis, left knee: Secondary | ICD-10-CM | POA: Insufficient documentation

## 2015-11-13 MED ORDER — CELECOXIB 200 MG PO CAPS: 200.0000 mg | ORAL_CAPSULE | Freq: Two times a day (BID) | ORAL | Status: DC

## 2015-11-13 NOTE — Patient Instructions (Signed)
You were given a prescription for Celebrex today.

## 2015-11-13 NOTE — Progress Notes (Signed)
Safety precautions to be maintained throughout the outpatient stay will include: orient to surroundings, keep bed in low position, maintain call bell within reach at all times, provide assistance with transfer out of bed and ambulation.  

## 2015-11-15 NOTE — Progress Notes (Signed)
   Subjective:    Patient ID: Kyle Marshall, male    DOB: 1991-01-07, 25 y.o.   MRN: [id number]  HPI  This patient returned to the clinic today indicating that he was not able to get the Celebrex medication that I prescribed for him. He indicated that his insurance company did not authorize that medication He however suggested that if I were  A hard copy of the prescription that he might be able to procure all the medication He indicated that he got several days of pain relief from his  Femoral nerve block but that the pain was gradually returning. Today his subjective pain intensity rating is 50% He is back at work and he has no other major problems  Review of Systems  Constitutional: Negative.   HENT: Negative.   Eyes: Negative.   Respiratory: Negative.   Cardiovascular: Negative.   Gastrointestinal: Negative.   Endocrine: Negative.   Genitourinary: Negative.   Musculoskeletal: Positive for joint swelling and arthralgias.  Skin: Negative.   Allergic/Immunologic: Negative.   Neurological: Negative.   Hematological: Negative.   Psychiatric/Behavioral: Negative.        Objective:   Physical Exam  Cardiovascular:  This patient is in no distress He has returned to work His subjective pain intensity rating is 50% His blood pressure is 141/65 mmHg His pulse is 87 bpm Equal and regular His respiration rate is 16 beats per minutes S PO2 was 98% Chest is clinically clear There is moderate tenderness of the knee There are no new neurological nor musculoskeletal detailed findings  Nursing note and vitals reviewed.         Assessment & Plan:   Assessment 1 chronic left knee pain 2 post traumatic osteoarthritis of the lef    Plan of management We will give the patient a  The prescription 200 mg twice a day after meals # 42 tablets with one refill To return to the clinic in 3 weeks   Established patient     Level III    Tod PersiaWinston Emiyah Spraggins M.D.

## 2015-12-04 ENCOUNTER — Ambulatory Visit: Payer: Medicaid Other | Admitting: Anesthesiology

## 2016-01-05 ENCOUNTER — Ambulatory Visit: Payer: Medicaid Other | Admitting: Internal Medicine

## 2016-01-21 ENCOUNTER — Telehealth: Payer: Self-pay | Admitting: *Deleted

## 2016-02-04 ENCOUNTER — Ambulatory Visit: Payer: Medicaid Other | Admitting: Pain Medicine

## 2016-02-10 ENCOUNTER — Ambulatory Visit (INDEPENDENT_AMBULATORY_CARE_PROVIDER_SITE_OTHER): Payer: Medicaid Other | Admitting: Internal Medicine

## 2016-02-10 ENCOUNTER — Encounter: Payer: Self-pay | Admitting: Internal Medicine

## 2016-02-10 VITALS — BP 124/70 | HR 107 | Temp 99.2°F | Ht 66.0 in | Wt 188.0 lb

## 2016-02-10 DIAGNOSIS — M1712 Unilateral primary osteoarthritis, left knee: Secondary | ICD-10-CM

## 2016-02-10 DIAGNOSIS — R030 Elevated blood-pressure reading, without diagnosis of hypertension: Secondary | ICD-10-CM

## 2016-02-10 DIAGNOSIS — E669 Obesity, unspecified: Secondary | ICD-10-CM

## 2016-02-10 DIAGNOSIS — R748 Abnormal levels of other serum enzymes: Secondary | ICD-10-CM

## 2016-02-10 DIAGNOSIS — M25562 Pain in left knee: Secondary | ICD-10-CM

## 2016-02-10 MED ORDER — DICLOFENAC SODIUM 75 MG PO TBEC: 75.0000 mg | DELAYED_RELEASE_TABLET | Freq: Two times a day (BID) | ORAL | 4 refills | Status: DC

## 2016-02-10 MED ORDER — OXYCODONE-ACETAMINOPHEN 5-325 MG PO TABS: 1.0000 | ORAL_TABLET | Freq: Every day | ORAL | 0 refills | Status: DC | PRN

## 2016-02-10 NOTE — Patient Instructions (Addendum)
I am prescribing diclofenac twice daily as your anti inflammatory  Percocet once daily for severe pain   MRI of knee

## 2016-02-10 NOTE — Progress Notes (Signed)
Subjective:  Patient ID: Kyle Marshall, male    DOB: [birth year]  Age: 25 y.o. MRN: [id number]  CC: The primary encounter diagnosis was Knee pain, left. Diagnoses of Elevated liver enzymes, Elevated blood pressure reading without diagnosis of hypertension, Primary osteoarthritis of left knee, and Obesity were also pertinent to this visit.  HPI [name] presents for follow up on hypertension and chronic pain   Wife Kyle Marshall had acute cholecystitis 2 months ago  Had emergent cholecystectomy.  Pain clinic referral made  For management of chronic knee pain previously treated with percocet. At last visit  Multiple modalities were  tried included two  femoral nerve blocks, in left  inguinal area. And a knee injection . Neither were helpful in reducing his pain and continues to report a tinglly feeling in the left leg  States that the Md that was treated has not returned from [place]    And the alternate has moved to [facility].  He was prescibed tramadol and meloxicam but not percocet   currently taking tylenol up to 2000 mg daily  . celebrex worked better than meloxicam but celebrex was too $$$ .  Tramadol did not help at all.  He reports that he has not used any  narcotics in a few weeks .   diclofenac  Trial discussed   REFUSES FLU VACCINE   Outpatient Medications Prior to Visit  Medication Sig Dispense Refill  . albuterol (PROVENTIL HFA;VENTOLIN HFA) 108 (90 BASE) MCG/ACT inhaler Inhale 2 puffs into the lungs every 4 (four) hours as needed for wheezing or shortness of breath. Reported on 08/26/2015    . Aspirin-Salicylamide-Caffeine (BC HEADACHE POWDER PO) Take by mouth as needed. Reported on 08/26/2015    . esomeprazole (NEXIUM) 40 MG capsule Take 40 mg by mouth daily at 12 noon. Reported on 08/26/2015    . Ibuprofen (EQ IBUPROFEN) 200 MG CAPS Take 4 capsules (800 mg total) by mouth 3 (three) times daily as needed. 1 each 0  . loratadine (CLARITIN) 10 MG tablet Take 10 mg by mouth daily.  Reported on 08/26/2015    . amoxicillin (AMOXIL) 500 MG tablet Take 1 tablet (500 mg total) by mouth 3 (three) times daily. 30 tablet 0  . celecoxib (CELEBREX) 200 MG capsule Take 1 capsule (200 mg total) by mouth 2 (two) times daily after a meal. 42 capsule 0  . celecoxib (CELEBREX) 200 MG capsule Take 1 capsule (200 mg total) by mouth 2 (two) times daily after a meal. 42 capsule 1  . meloxicam (MOBIC) 15 MG tablet Take 1 tablet (15 mg total) by mouth daily. 90 tablet 2  . oxyCODONE-acetaminophen (ROXICET) 5-325 MG tablet Take 1 tablet by mouth every 6 (six) hours as needed. 9 tablet 0  . traMADol (ULTRAM) 50 MG tablet Take 1 tablet (50 mg total) by mouth every 8 (eight) hours as needed. 120 tablet 2   Facility-Administered Medications Prior to Visit  Medication Dose Route Frequency Provider Last Rate Last Dose  . bupivacaine (MARCAINE) 0.5 % injection 10 mL  10 mL Infiltration Once Lance Bosch, MD        Review of Systems;  Patient denies headache, fevers, malaise, unintentional weight loss, skin rash, eye pain, sinus congestion and sinus pain, sore throat, dysphagia,  hemoptysis , cough, dyspnea, wheezing, chest pain, palpitations, orthopnea, edema, abdominal pain, nausea, melena, diarrhea, constipation, flank pain, dysuria, hematuria, urinary  Frequency, nocturia, numbness, tingling, seizures,  Focal weakness, Loss of consciousness,  Tremor, insomnia,  depression, anxiety, and suicidal ideation.      Objective:  BP 124/70   Pulse (!) 107   Temp 99.2 F (37.3 C) (Oral)   Ht '5\' 6"'$  (1.676 m)   Wt 188 lb (85.3 kg)   SpO2 99%   BMI 30.34 kg/m   BP Readings from Last 3 Encounters:  02/10/16 124/70  11/13/15 (!) 141/65  10/16/15 (!) 140/92    Wt Readings from Last 3 Encounters:  02/10/16 188 lb (85.3 kg)  11/13/15 180 lb (81.6 kg)  10/16/15 180 lb (81.6 kg)    General appearance: alert, cooperative and appears stated age Ears: normal TM's and external ear canals both  ears Throat: lips, mucosa, and tongue normal; teeth and gums normal Neck: no adenopathy, no carotid bruit, supple, symmetrical, trachea midline and thyroid not enlarged, symmetric, no tenderness/mass/nodules Back: symmetric, no curvature. ROM normal. No CVA tenderness. Lungs: clear to auscultation bilaterally Heart: regular rate and rhythm, S1, S2 normal, no murmur, click, rub or gallop Abdomen: soft, non-tender; bowel sounds normal; no masses,  no organomegaly Pulses: 2+ and symmetric Skin: Skin color, texture, turgor normal. No rashes or lesions Lymph nodes: Cervical, supraclavicular, and axillary nodes normal.  No results found for: HGBA1C  Lab Results  Component Value Date   CREATININE 0.92 01/01/2015    Lab Results  Component Value Date   WBC 6.9 01/01/2015   HGB 15.1 01/01/2015   HCT 44.4 01/01/2015   PLT 200 01/01/2015   GLUCOSE 96 01/01/2015   CHOL 271 (H) 07/06/2015   TRIG 306.0 (H) 07/06/2015   HDL 43.70 07/06/2015   LDLDIRECT 184.0 07/06/2015   ALT 54 (H) 02/10/2016   AST 29 02/10/2016   NA 138 01/01/2015   K 4.8 01/01/2015   CL 108 01/01/2015   CREATININE 0.92 01/01/2015   BUN 9 01/01/2015   CO2 26 01/01/2015    No results found.  Assessment & Plan:   Problem List Items Addressed This Visit    Elevated blood pressure reading without diagnosis of hypertension    Improved with dietary changes.  Recent elevations have been attributed to pain HIs BP is normal today.  Will have him return in 1 month for fasting labs and RN visit to recheck BP   Lab Results  Component Value Date   CREATININE 0.92 01/01/2015   Lab Results  Component Value Date   NA 138 01/01/2015   K 4.8 01/01/2015   CL 108 01/01/2015   CO2 26 01/01/2015         Relevant Orders   Microalbumin / creatinine urine ratio   Lipid panel   OA (osteoarthritis) of knee    Chronic pain not relieved with femoral nerve block done by Pain clinic. MRI knee and Orthopedics consult advised  .continue NSAID with trial of diclofenac.   Will write for percocet given falure to respond to tramadol and history of allergy to hydrocodone. Refill history reviewed via [place] Controlled Substance databas, accessed by me today..      Relevant Medications   diclofenac (VOLTAREN) 75 MG EC tablet   oxyCODONE-acetaminophen (ROXICET) 5-325 MG tablet   Obesity    I have addressed  BMI and recommended a low glycemic index diet utilizing smaller more frequent meals to increase metabolism.  I have also recommended that patient start exercising with a goal of 30 minutes of aerobic exercise a minimum of 5 days per week. Screening for lipid disorders, thyroid and diabetes to be done .  Knee pain, left - Primary   Relevant Orders   MR Knee Left  Wo Contrast   Elevated liver enzymes   Relevant Orders   Comp Met (CMET)    Other Visit Diagnoses   None.     I have discontinued Mr. Thielen traMADol, meloxicam, amoxicillin, celecoxib, and celecoxib. I have also changed his oxyCODONE-acetaminophen. Additionally, I am having him start on diclofenac. Lastly, I am having him maintain his loratadine, Aspirin-Salicylamide-Caffeine (BC HEADACHE POWDER PO), albuterol, esomeprazole, and Ibuprofen. We will continue to administer bupivacaine.  Meds ordered this encounter  Medications  . diclofenac (VOLTAREN) 75 MG EC tablet    Sig: Take 1 tablet (75 mg total) by mouth 2 (two) times daily.    Dispense:  60 tablet    Refill:  4  . oxyCODONE-acetaminophen (ROXICET) 5-325 MG tablet    Sig: Take 1 tablet by mouth daily as needed.    Dispense:  30 tablet    Refill:  0    Medications Discontinued During This Encounter  Medication Reason  . celecoxib (CELEBREX) 200 MG capsule Patient Preference  . celecoxib (CELEBREX) 200 MG capsule Patient Preference  . meloxicam (MOBIC) 15 MG tablet Patient Preference  . traMADol (ULTRAM) 50 MG tablet Patient Preference  . amoxicillin (AMOXIL) 500 MG tablet Completed Course   . oxyCODONE-acetaminophen (ROXICET) 5-325 MG tablet Reorder   A total of 40 minutes was spent with patient more than half of which was spent in counseling patient on the above mentioned issues , reviewing and explaining recent labs and imaging studies done, and coordination of care.  Follow-up: No Follow-up on file.   Crecencio Mc, MD

## 2016-02-11 LAB — HEPATIC FUNCTION PANEL
ALT: 54 U/L — ABNORMAL HIGH (ref 0–53)
AST: 29 U/L (ref 0–37)
Albumin: 4.8 g/dL (ref 3.5–5.2)
Alkaline Phosphatase: 71 U/L (ref 39–117)
Bilirubin, Direct: 0 mg/dL (ref 0.0–0.3)
Total Bilirubin: 0.4 mg/dL (ref 0.2–1.2)
Total Protein: 8.1 g/dL (ref 6.0–8.3)

## 2016-02-11 LAB — HEPATITIS B SURFACE ANTIGEN: HEP B S AG: NEGATIVE

## 2016-02-11 LAB — IRON AND TIBC
%SAT: 18 % (ref 15–60)
IRON: 65 ug/dL (ref 50–195)
TIBC: 358 ug/dL (ref 250–425)
UIBC: 293 ug/dL (ref 125–400)

## 2016-02-11 LAB — HEPATITIS B SURFACE ANTIBODY,QUALITATIVE: HEP B S AB: NEGATIVE

## 2016-02-11 LAB — HEPATITIS B CORE ANTIBODY, TOTAL: Hep B Core Total Ab: NONREACTIVE

## 2016-02-11 LAB — FERRITIN: FERRITIN: 54 ng/mL (ref 22.0–322.0)

## 2016-02-11 LAB — HEPATITIS C ANTIBODY: HCV Ab: NEGATIVE

## 2016-02-13 DIAGNOSIS — E669 Obesity, unspecified: Secondary | ICD-10-CM | POA: Insufficient documentation

## 2016-02-13 NOTE — Assessment & Plan Note (Addendum)
Chronic pain not relieved with femoral nerve block done by Pain clinic. MRI knee and Orthopedics consult advised .continue NSAID with trial of diclofenac.   Will write for percocet given falure to respond to tramadol and history of allergy to hydrocodone. Refill history reviewed via [place] Controlled Substance databas, accessed by me today..Kyle Marshall

## 2016-02-13 NOTE — Assessment & Plan Note (Addendum)
Improved with dietary changes.  Recent elevations have been attributed to pain HIs BP is normal today.  Will have him return in 1 month for fasting labs and RN visit to recheck BP   Lab Results  Component Value Date   CREATININE 0.92 01/01/2015   Lab Results  Component Value Date   NA 138 01/01/2015   K 4.8 01/01/2015   CL 108 01/01/2015   CO2 26 01/01/2015

## 2016-02-13 NOTE — Assessment & Plan Note (Signed)
>>  ASSESSMENT AND PLAN FOR OA (OSTEOARTHRITIS) OF KNEE WRITTEN ON 02/13/2016  4:02 PM BY TULLO, TERESA L, MD  Chronic pain not relieved with femoral nerve block done by Pain clinic. MRI knee and Orthopedics consult advised .continue NSAID with trial of diclofenac.   Will write for percocet given falure to respond to tramadol and history of allergy to hydrocodone. Refill history reviewed via [place] Controlled Substance databas, accessed by me today.Marland Kitchen

## 2016-02-13 NOTE — Assessment & Plan Note (Signed)
I have addressed  BMI and recommended a low glycemic index diet utilizing smaller more frequent meals to increase metabolism.  I have also recommended that patient start exercising with a goal of 30 minutes of aerobic exercise a minimum of 5 days per week. Screening for lipid disorders, thyroid and diabetes to be done   

## 2016-02-16 ENCOUNTER — Telehealth: Payer: Self-pay | Admitting: Internal Medicine

## 2016-02-16 ENCOUNTER — Other Ambulatory Visit: Payer: Self-pay | Admitting: Internal Medicine

## 2016-02-16 DIAGNOSIS — R748 Abnormal levels of other serum enzymes: Secondary | ICD-10-CM

## 2016-02-16 NOTE — Telephone Encounter (Signed)
Ok. Thank you.

## 2016-02-16 NOTE — Telephone Encounter (Signed)
Pt is calling about his lab results. Please call [phone].

## 2016-02-16 NOTE — Telephone Encounter (Signed)
Pt was given results and schedule for labs(930 fasting labs) and nurse visit (BP check). Pt may callback to verify lab appt.

## 2016-02-24 ENCOUNTER — Ambulatory Visit: Payer: Medicaid Other

## 2016-03-07 ENCOUNTER — Telehealth: Payer: Self-pay | Admitting: Internal Medicine

## 2016-03-07 DIAGNOSIS — M25562 Pain in left knee: Principal | ICD-10-CM

## 2016-03-07 DIAGNOSIS — G8929 Other chronic pain: Secondary | ICD-10-CM

## 2016-03-07 NOTE — Telephone Encounter (Signed)
Spoke with the patient, agreed with referral to a orthopedic.  He would like one local if possible. thanks

## 2016-03-07 NOTE — Telephone Encounter (Signed)
Medicaid has denied coverage /athorization for MRI knee until he sees an orthopedist.  Would he like me to make a referral?

## 2016-03-08 NOTE — Telephone Encounter (Signed)
Tried to reach patient by phone no voicemail.

## 2016-03-08 NOTE — Telephone Encounter (Signed)
Your referral is in process as requested. Our referral coordinator will call you when the appointment has been made.  

## 2016-03-08 NOTE — Telephone Encounter (Signed)
I told him about this referral yesterday. thanks

## 2016-03-17 ENCOUNTER — Ambulatory Visit (INDEPENDENT_AMBULATORY_CARE_PROVIDER_SITE_OTHER): Payer: Medicaid Other

## 2016-03-17 ENCOUNTER — Other Ambulatory Visit (INDEPENDENT_AMBULATORY_CARE_PROVIDER_SITE_OTHER): Payer: Medicaid Other

## 2016-03-17 VITALS — BP 130/96 | HR 78

## 2016-03-17 DIAGNOSIS — R748 Abnormal levels of other serum enzymes: Secondary | ICD-10-CM

## 2016-03-17 DIAGNOSIS — R030 Elevated blood-pressure reading, without diagnosis of hypertension: Secondary | ICD-10-CM

## 2016-03-17 DIAGNOSIS — R7989 Other specified abnormal findings of blood chemistry: Secondary | ICD-10-CM

## 2016-03-17 LAB — COMPREHENSIVE METABOLIC PANEL
ALT: 45 U/L (ref 0–53)
AST: 28 U/L (ref 0–37)
Albumin: 4.7 g/dL (ref 3.5–5.2)
Alkaline Phosphatase: 72 U/L (ref 39–117)
BUN: 9 mg/dL (ref 6–23)
CALCIUM: 10 mg/dL (ref 8.4–10.5)
CHLORIDE: 104 meq/L (ref 96–112)
CO2: 28 meq/L (ref 19–32)
CREATININE: 0.84 mg/dL (ref 0.40–1.50)
GFR: 118.31 mL/min (ref >60.00)
GLUCOSE: 82 mg/dL (ref 70–99)
Potassium: 4 mEq/L (ref 3.5–5.1)
SODIUM: 141 meq/L (ref 135–145)
Total Bilirubin: 0.5 mg/dL (ref 0.2–1.2)
Total Protein: 7.5 g/dL (ref 6.0–8.3)

## 2016-03-17 LAB — LDL CHOLESTEROL, DIRECT: Direct LDL: 135 mg/dL

## 2016-03-17 LAB — MICROALBUMIN / CREATININE URINE RATIO
CREATININE, U: 213.2 mg/dL
Microalb Creat Ratio: 0.7 mg/g (ref 0.0–30.0)
Microalb, Ur: 1.5 mg/dL (ref 0.0–1.9)

## 2016-03-17 LAB — LIPID PANEL
Cholesterol: 206 mg/dL — ABNORMAL HIGH (ref 0–200)
HDL: 40 mg/dL (ref >39.00)
NONHDL: 165.78
TRIGLYCERIDES: 259 mg/dL — AB (ref 0.0–149.0)
Total CHOL/HDL Ratio: 5
VLDL: 51.8 mg/dL — AB (ref 0.0–40.0)

## 2016-03-17 NOTE — Progress Notes (Signed)
Patient comes in for 1 month blood pressure check.   He also inquires on abdominal ultrasound since lab showed elevated liver function.  Followed up with Amada KingfisherMelissa Tuck and US was denied coverage due to medicaid.   Please advise on plan with elevated liver enzymes..Marland Kitchen

## 2016-03-22 ENCOUNTER — Other Ambulatory Visit: Payer: Self-pay | Admitting: Internal Medicine

## 2016-03-22 DIAGNOSIS — R748 Abnormal levels of other serum enzymes: Secondary | ICD-10-CM

## 2016-03-22 NOTE — Progress Notes (Signed)
  I have reviewed the above information and agree with above.   Teresa Tullo, MD 

## 2016-03-22 NOTE — Progress Notes (Signed)
[facility] for management of elevated liver enzymes

## 2016-03-25 ENCOUNTER — Encounter: Payer: Self-pay | Admitting: Internal Medicine

## 2016-04-20 ENCOUNTER — Emergency Department
Admission: EM | Admit: 2016-04-20 | Discharge: 2016-04-20 | Disposition: A | Discharge status: Home / Self Care | Payer: No Typology Code available for payment source | Attending: Emergency Medicine | Admitting: Emergency Medicine

## 2016-04-20 ENCOUNTER — Encounter: Payer: Self-pay | Admitting: *Deleted

## 2016-04-20 DIAGNOSIS — Y999 Unspecified external cause status: Secondary | ICD-10-CM | POA: Diagnosis not present

## 2016-04-20 DIAGNOSIS — F1290 Cannabis use, unspecified, uncomplicated: Secondary | ICD-10-CM | POA: Insufficient documentation

## 2016-04-20 DIAGNOSIS — Y9389 Activity, other specified: Secondary | ICD-10-CM | POA: Insufficient documentation

## 2016-04-20 DIAGNOSIS — Y92410 Unspecified street and highway as the place of occurrence of the external cause: Secondary | ICD-10-CM | POA: Insufficient documentation

## 2016-04-20 DIAGNOSIS — M546 Pain in thoracic spine: Secondary | ICD-10-CM | POA: Diagnosis not present

## 2016-04-20 DIAGNOSIS — Z79899 Other long term (current) drug therapy: Secondary | ICD-10-CM | POA: Insufficient documentation

## 2016-04-20 DIAGNOSIS — J45909 Unspecified asthma, uncomplicated: Secondary | ICD-10-CM | POA: Diagnosis not present

## 2016-04-20 DIAGNOSIS — Z7982 Long term (current) use of aspirin: Secondary | ICD-10-CM | POA: Insufficient documentation

## 2016-04-20 DIAGNOSIS — Z87891 Personal history of nicotine dependence: Secondary | ICD-10-CM | POA: Diagnosis not present

## 2016-04-20 DIAGNOSIS — S299XXA Unspecified injury of thorax, initial encounter: Secondary | ICD-10-CM | POA: Diagnosis present

## 2016-04-20 MED ORDER — NABUMETONE 750 MG PO TABS: 750.0000 mg | ORAL_TABLET | Freq: Two times a day (BID) | ORAL | 0 refills | Status: DC

## 2016-04-20 MED ORDER — CYCLOBENZAPRINE HCL 5 MG PO TABS: 5.0000 mg | ORAL_TABLET | Freq: Three times a day (TID) | ORAL | 0 refills | Status: DC | PRN

## 2016-04-20 NOTE — ED Triage Notes (Signed)
[facility] and is having mid back pain since, ambulatory to triage
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
## 2016-05-16 MED ORDER — LIDOCAINE HCL (PF) 1 % IJ SOLN
INTRAMUSCULAR | Status: AC
  Filled 2016-05-16: qty 5

## 2016-08-10 ENCOUNTER — Emergency Department
Admission: EM | Admit: 2016-08-10 | Discharge: 2016-08-10 | Disposition: A | Discharge status: Home / Self Care | Payer: Self-pay | Attending: Emergency Medicine | Admitting: Emergency Medicine

## 2016-08-10 DIAGNOSIS — Z87891 Personal history of nicotine dependence: Secondary | ICD-10-CM | POA: Insufficient documentation

## 2016-08-10 DIAGNOSIS — F1290 Cannabis use, unspecified, uncomplicated: Secondary | ICD-10-CM | POA: Insufficient documentation

## 2016-08-10 DIAGNOSIS — J45909 Unspecified asthma, uncomplicated: Secondary | ICD-10-CM | POA: Insufficient documentation

## 2016-08-10 DIAGNOSIS — Z79899 Other long term (current) drug therapy: Secondary | ICD-10-CM | POA: Insufficient documentation

## 2016-08-10 DIAGNOSIS — K529 Noninfective gastroenteritis and colitis, unspecified: Secondary | ICD-10-CM | POA: Insufficient documentation

## 2016-08-10 DIAGNOSIS — E860 Dehydration: Secondary | ICD-10-CM | POA: Insufficient documentation

## 2016-08-10 LAB — URINALYSIS, COMPLETE (UACMP) WITH MICROSCOPIC
BILIRUBIN URINE: NEGATIVE
Bacteria, UA: NONE SEEN
GLUCOSE, UA: NEGATIVE mg/dL
HGB URINE DIPSTICK: NEGATIVE
KETONES UR: NEGATIVE mg/dL
Leukocytes, UA: NEGATIVE
NITRITE: NEGATIVE
PROTEIN: NEGATIVE mg/dL
RBC / HPF: NONE SEEN RBC/hpf (ref 0–5)
Specific Gravity, Urine: 1.006 (ref 1.005–1.030)
Squamous Epithelial / LPF: NONE SEEN
pH: 6 (ref 5.0–8.0)

## 2016-08-10 LAB — COMPREHENSIVE METABOLIC PANEL
ALT: 41 U/L (ref 17–63)
AST: 28 U/L (ref 15–41)
Albumin: 4.4 g/dL (ref 3.5–5.0)
Alkaline Phosphatase: 69 U/L (ref 38–126)
Anion gap: 9 (ref 5–15)
BUN: 6 mg/dL (ref 6–20)
CHLORIDE: 105 mmol/L (ref 101–111)
CO2: 24 mmol/L (ref 22–32)
Calcium: 9.2 mg/dL (ref 8.9–10.3)
Creatinine, Ser: 0.86 mg/dL (ref 0.61–1.24)
Glucose, Bld: 89 mg/dL (ref 65–99)
POTASSIUM: 3.9 mmol/L (ref 3.5–5.1)
SODIUM: 138 mmol/L (ref 135–145)
Total Bilirubin: 0.6 mg/dL (ref 0.3–1.2)
Total Protein: 7.7 g/dL (ref 6.5–8.1)

## 2016-08-10 LAB — CBC
HCT: 43.4 % (ref 40.0–52.0)
Hemoglobin: 15.4 g/dL (ref 13.0–18.0)
MCH: 31.8 pg (ref 26.0–34.0)
MCHC: 35.4 g/dL (ref 32.0–36.0)
MCV: 89.7 fL (ref 80.0–100.0)
PLATELETS: 249 10*3/uL (ref 150–440)
RBC: 4.84 MIL/uL (ref 4.40–5.90)
RDW: 13.2 % (ref 11.5–14.5)
WBC: 11 10*3/uL — AB (ref 3.8–10.6)

## 2016-08-10 MED ORDER — SODIUM CHLORIDE 0.9 % IV BOLUS (SEPSIS)
1000.0000 mL | Freq: Once | INTRAVENOUS | Status: AC
  Administered 2016-08-10: 1000 mL via INTRAVENOUS

## 2016-08-10 MED ORDER — KETOROLAC TROMETHAMINE 30 MG/ML IJ SOLN
30.0000 mg | Freq: Once | INTRAMUSCULAR | Status: AC
  Administered 2016-08-10: 30 mg via INTRAVENOUS
  Filled 2016-08-10: qty 1

## 2016-08-10 MED ORDER — LOPERAMIDE HCL 2 MG PO CAPS
4.0000 mg | ORAL_CAPSULE | Freq: Once | ORAL | Status: AC
  Administered 2016-08-10: 4 mg via ORAL
  Filled 2016-08-10: qty 2

## 2016-08-10 MED ORDER — ONDANSETRON 4 MG PO TBDP: 4.0000 mg | ORAL_TABLET | Freq: Three times a day (TID) | ORAL | 0 refills | Status: DC | PRN

## 2016-08-10 MED ORDER — ONDANSETRON HCL 4 MG/2ML IJ SOLN
4.0000 mg | Freq: Once | INTRAMUSCULAR | Status: AC
  Administered 2016-08-10: 4 mg via INTRAVENOUS
  Filled 2016-08-10: qty 2

## 2016-08-10 MED ORDER — LOPERAMIDE HCL 2 MG PO TABS: 2.0000 mg | ORAL_TABLET | Freq: Four times a day (QID) | ORAL | 0 refills | Status: DC | PRN

## 2016-08-10 NOTE — ED Triage Notes (Signed)
Pt c/o bodyaches with N/V/D since Monday..Marland Kitchen

## 2016-08-10 NOTE — ED Provider Notes (Signed)
[facility] Emergency Department Provider Note  Time seen: 8:39 AM  I have reviewed the triage vital signs and the nursing notes.   HISTORY  Chief Complaint Emesis and Diarrhea    HPI Kyle Marshall is a 26 y.o. male who presents the emergency department for nausea vomiting and diarrhea. According to the patient for the past 3 days he has been nauseated with occasional episodes of vomiting, states he has not been able to keep much down over the past 3 days, states occasional episodes of diarrhea. Has not taken any over-the-counter medications. Denies any known fever. Patient states symptoms started Monday continued all day yesterday and have continued today. Denies any focal abdominal pain. But does state some generalized cramping.  Past Medical History:  Diagnosis Date  . Abdominal pain 02/12/2008  . Allergic rhinitis 12/08/2014  . Asthma   . Difficulty hearing 02/09/2015    Patient Active Problem List   Diagnosis Date Noted  . Obesity 02/13/2016  . Knee pain, left 08/26/2015  . OA (osteoarthritis) of knee 08/26/2015  . Elevated liver enzymes 07/08/2015  . Elevated blood pressure reading without diagnosis of hypertension 06/06/2015  . Generalized anxiety disorder 06/06/2015  . Generalized OA 06/06/2015  . History of syncope 06/06/2015  . Asthma 12/08/2014  . Allergic rhinitis 12/08/2014    Past Surgical History:  Procedure Laterality Date  . EAR TUBE REMOVAL    . SHOULDER SURGERY Right 2012    Prior to Admission medications   Medication Sig Start Date End Date Taking? Authorizing Provider  albuterol (PROVENTIL HFA;VENTOLIN HFA) 108 (90 BASE) MCG/ACT inhaler Inhale 2 puffs into the lungs every 4 (four) hours as needed for wheezing or shortness of breath. Reported on 08/26/2015    Historical Provider, MD  Aspirin-Salicylamide-Caffeine (BC HEADACHE POWDER PO) Take by mouth as needed. Reported on 08/26/2015    Historical Provider, MD  cyclobenzaprine  (FLEXERIL) 5 MG tablet Take 1 tablet (5 mg total) by mouth 3 (three) times daily as needed for muscle spasms. 04/20/16   Jenise V Bacon Menshew, PA-C  diclofenac (VOLTAREN) 75 MG EC tablet Take 1 tablet (75 mg total) by mouth 2 (two) times daily. 02/10/16   Sherlene Shamseresa L Tullo, MD  esomeprazole (NEXIUM) 40 MG capsule Take 40 mg by mouth daily at 12 noon. Reported on 08/26/2015    Historical Provider, MD  Ibuprofen (EQ IBUPROFEN) 200 MG CAPS Take 4 capsules (800 mg total) by mouth 3 (three) times daily as needed. 03/18/15   Lorie PhenixNancy Maloney, MD  loratadine (CLARITIN) 10 MG tablet Take 10 mg by mouth daily. Reported on 08/26/2015    Historical Provider, MD  nabumetone (RELAFEN) 750 MG tablet Take 1 tablet (750 mg total) by mouth 2 (two) times daily. 04/20/16   Jenise V Bacon Menshew, PA-C  oxyCODONE-acetaminophen (ROXICET) 5-325 MG tablet Take 1 tablet by mouth daily as needed. 02/10/16   Sherlene Shamseresa L Tullo, MD    Allergies  Allergen Reactions  . Vicodin [Hydrocodone-Acetaminophen] Nausea And Vomiting    Family History  Problem Relation Age of Onset  . Cancer Mother     breast  . Atrial fibrillation Mother   . Cancer Father     brain, lung     Social History Social History  Substance Use Topics  . Smoking status: Former Games developermoker  . Smokeless tobacco: Never Used  . Alcohol use 0.0 oz/week     Comment: seldom    Review of Systems Constitutional: Negative for fever. Cardiovascular: Negative for  chest pain. Respiratory: Negative for shortness of breath. Gastrointestinal: Mild abdominal cramping. Positive for nausea vomiting diarrhea Genitourinary: Negative for dysuria. Neurological: Positive for moderate headache. Denies focal weakness or numbness. 10-point ROS otherwise negative.  ____________________________________________   PHYSICAL EXAM:  VITAL SIGNS: ED Triage Vitals  Enc Vitals Group     BP 08/10/16 0756 (!) 142/96     Pulse Rate 08/10/16 0756 (!) 115     Resp 08/10/16 0756 18      Temp 08/10/16 0756 98.8 F (37.1 C)     Temp Source 08/10/16 0756 Oral     SpO2 08/10/16 0756 99 %     Weight 08/10/16 0757 180 lb (81.6 kg)     Height 08/10/16 0757 5\' 6"  (1.676 m)     Head Circumference --      Peak Flow --      Pain Score 08/10/16 0757 5     Pain Loc --      Pain Edu? --      Excl. in GC? --     Constitutional: Alert and oriented. Well appearing and in no distress. Eyes: Normal exam ENT   Head: Normocephalic and atraumatic.   Mouth/Throat: Mucous membranes are moist. Cardiovascular: Regular rhythm, rate around 110. No murmur. Respiratory: Normal respiratory effort without tachypnea nor retractions. Breath sounds are clear Gastrointestinal: Soft and nontender. No distention. No reaction to abdominal palpation. Musculoskeletal: Nontender with normal range of motion in all extremities. Neurologic:  Normal speech and language. No gross focal neurologic deficits Skin:  Skin is warm, dry and intact.  Psychiatric: Mood and affect are normal.   ____________________________________________    INITIAL IMPRESSION / ASSESSMENT AND PLAN / ED COURSE  Pertinent labs & imaging results that were available during my care of the patient were reviewed by me and considered in my medical decision making (see chart for details).  Patient presents the emergency department nausea vomiting diarrhea for the past 48 hours. We will check labs, treat with IV fluids, Zofran and Toradol and monitor closely. We will dose loperamide for diarrhea. Overall the patient's symptoms are most suggestive of gastroenteritis. Nontender abdominal exam.  Patient's labs are largely within normal limits besides a slight leukocytosis. Very suggestive of gastroenteritis. We will treat with Zofran, loperamide and supportive care at home. I discussed my normal abdominal pain return precautions.  ____________________________________________   FINAL CLINICAL IMPRESSION(S) / ED  DIAGNOSES  Gastroenteritis    Minna Antis, MD 08/10/16 1040

## 2016-09-14 ENCOUNTER — Telehealth: Payer: Self-pay | Admitting: Radiology

## 2016-09-14 DIAGNOSIS — E7800 Pure hypercholesterolemia, unspecified: Secondary | ICD-10-CM

## 2016-09-14 NOTE — Addendum Note (Signed)
Addended by: Sherlene Shams on: 09/14/2016 03:51 PM   Modules accepted: Orders

## 2016-09-14 NOTE — Telephone Encounter (Signed)
Fasting lipids only;  He  had labs done last month in ER

## 2016-09-14 NOTE — Telephone Encounter (Signed)
Pt coming in for labs on Friday, please place future orders. Thank you

## 2016-09-16 ENCOUNTER — Other Ambulatory Visit (INDEPENDENT_AMBULATORY_CARE_PROVIDER_SITE_OTHER): Payer: Self-pay

## 2016-09-16 DIAGNOSIS — E7800 Pure hypercholesterolemia, unspecified: Secondary | ICD-10-CM

## 2016-09-16 LAB — LIPID PANEL
Cholesterol: 201 mg/dL — ABNORMAL HIGH (ref 0–200)
HDL: 34.9 mg/dL — AB (ref >39.00)
NONHDL: 166.49
TRIGLYCERIDES: 244 mg/dL — AB (ref 0.0–149.0)
Total CHOL/HDL Ratio: 6
VLDL: 48.8 mg/dL — AB (ref 0.0–40.0)

## 2016-09-16 LAB — LDL CHOLESTEROL, DIRECT: LDL DIRECT: 136 mg/dL

## 2018-03-21 ENCOUNTER — Ambulatory Visit: Payer: Self-pay | Admitting: Internal Medicine

## 2018-08-31 ENCOUNTER — Ambulatory Visit: Payer: Self-pay | Admitting: *Deleted

## 2018-08-31 NOTE — Telephone Encounter (Signed)
Best contact [phone] wife [phone] Patient states he has been experiencing numbness/tingling that started in finger tips and has gradually over time moved into hand and wrist. Patient states he works at [employer] and does do repetitive work at times- he states it has gotten worse over the last month and he would like to talk to Dr Darrick Huntsman about it. Patient also has been having some trouble with recall- memory- he reports he is under a lot of stress and he is not sleeping well- he has a lot of family responsibilities that he feels he has to handles by himself. Patient is aware the office is closed and he will wait until Monday for call back to schedule virtual visit. He states he has a bill- and can pay some on it . ( Patient does have cyst on back of head- has been present for some time) Reason for Disposition . [1] Numbness or tingling in one or both hands AND [2] is a chronic symptom (recurrent or ongoing AND present > 4 weeks)  Answer Assessment - Initial Assessment Questions 1. SYMPTOM: "What is the main symptom you are concerned about?" (e.g., weakness, numbness)     Hand and wrist numbness- getting worse- tingling- almost like they are asleep 2. ONSET: "When did this start?" (minutes, hours, days; while sleeping)     1 month ago- started in finger tips- now whole finger feels asleep- R hand is worse 3. LAST NORMAL: "When was the last time you were normal (no symptoms)?"     Over 1 month 4. PATTERN "Does this come and go, or has it been constant since it started?"  "Is it present now?"     Constant- R hand gets worse with use 5. CARDIAC SYMPTOMS: "Have you had any of the following symptoms: chest pain, difficulty breathing, palpitations?"     Some chest pain at times 6. NEUROLOGIC SYMPTOMS: "Have you had any of the following symptoms: headache, dizziness, vision loss, double vision, changes in speech, unsteady on your feet?"     Memory issues- recall 7. OTHER SYMPTOMS: "Do you have  any other symptoms?"     Cyst on head- soft  8. PREGNANCY: "Is there any chance you are pregnant?" "When was your last menstrual period?"     n/a  Protocols used: NEUROLOGIC DEFICIT-A-AH

## 2018-09-03 NOTE — Telephone Encounter (Signed)
Please call and set up appointment for virtual with dr. Darrick Huntsman.

## 2018-09-05 ENCOUNTER — Ambulatory Visit (INDEPENDENT_AMBULATORY_CARE_PROVIDER_SITE_OTHER): Payer: Self-pay | Admitting: Internal Medicine

## 2018-09-05 DIAGNOSIS — R200 Anesthesia of skin: Secondary | ICD-10-CM

## 2018-09-05 DIAGNOSIS — Z79899 Other long term (current) drug therapy: Secondary | ICD-10-CM

## 2018-09-05 DIAGNOSIS — G894 Chronic pain syndrome: Secondary | ICD-10-CM

## 2018-09-05 DIAGNOSIS — J301 Allergic rhinitis due to pollen: Secondary | ICD-10-CM

## 2018-09-05 MED ORDER — LEVOCETIRIZINE DIHYDROCHLORIDE 5 MG PO TABS: 5.0000 mg | ORAL_TABLET | Freq: Every evening | ORAL | 1 refills | Status: DC

## 2018-09-05 MED ORDER — DULOXETINE HCL 20 MG PO CPEP: 20.0000 mg | ORAL_CAPSULE | Freq: Every day | ORAL | 3 refills | Status: DC

## 2018-09-05 MED ORDER — FLUTICASONE PROPIONATE 50 MCG/ACT NA SUSP: 2.0000 | Freq: Every day | NASAL | 6 refills | Status: DC
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
## 2018-09-06 ENCOUNTER — Other Ambulatory Visit: Payer: Self-pay

## 2018-09-06 ENCOUNTER — Ambulatory Visit (INDEPENDENT_AMBULATORY_CARE_PROVIDER_SITE_OTHER): Payer: Self-pay

## 2018-09-06 ENCOUNTER — Other Ambulatory Visit (INDEPENDENT_AMBULATORY_CARE_PROVIDER_SITE_OTHER): Payer: Self-pay

## 2018-09-06 DIAGNOSIS — R200 Anesthesia of skin: Secondary | ICD-10-CM

## 2018-09-06 DIAGNOSIS — Z79899 Other long term (current) drug therapy: Secondary | ICD-10-CM

## 2018-09-07 LAB — COMPREHENSIVE METABOLIC PANEL
ALT: 28 U/L (ref 0–53)
AST: 23 U/L (ref 0–37)
Albumin: 4.4 g/dL (ref 3.5–5.2)
Alkaline Phosphatase: 72 U/L (ref 39–117)
BUN: 8 mg/dL (ref 6–23)
CO2: 29 mEq/L (ref 19–32)
Calcium: 9.6 mg/dL (ref 8.4–10.5)
Chloride: 101 mEq/L (ref 96–112)
Creatinine, Ser: 0.98 mg/dL (ref 0.40–1.50)
GFR: 91.4 mL/min (ref >60.00)
Glucose, Bld: 69 mg/dL — ABNORMAL LOW (ref 70–99)
Potassium: 3.8 mEq/L (ref 3.5–5.1)
Sodium: 138 mEq/L (ref 135–145)
Total Bilirubin: 0.5 mg/dL (ref 0.2–1.2)
Total Protein: 7.1 g/dL (ref 6.0–8.3)

## 2018-09-07 LAB — CBC WITH DIFFERENTIAL/PLATELET
Basophils Absolute: 0.1 10*3/uL (ref 0.0–0.1)
Basophils Relative: 0.8 % (ref 0.0–3.0)
Eosinophils Absolute: 0.2 10*3/uL (ref 0.0–0.7)
Eosinophils Relative: 2.7 % (ref 0.0–5.0)
HCT: 41.2 % (ref 39.0–52.0)
Hemoglobin: 14.2 g/dL (ref 13.0–17.0)
Lymphocytes Relative: 24.7 % (ref 12.0–46.0)
Lymphs Abs: 2 10*3/uL (ref 0.7–4.0)
MCHC: 34.4 g/dL (ref 30.0–36.0)
MCV: 91.1 fl (ref 78.0–100.0)
Monocytes Absolute: 0.6 10*3/uL (ref 0.1–1.0)
Monocytes Relative: 7.9 % (ref 3.0–12.0)
Neutro Abs: 5.1 10*3/uL (ref 1.4–7.7)
Neutrophils Relative %: 63.9 % (ref 43.0–77.0)
Platelets: 258 10*3/uL (ref 150.0–400.0)
RBC: 4.52 Mil/uL (ref 4.22–5.81)
RDW: 13.5 % (ref 11.5–15.5)
WBC: 8 10*3/uL (ref 4.0–10.5)

## 2018-09-07 LAB — TSH: TSH: 1.51 u[IU]/mL (ref 0.35–4.50)

## 2018-09-08 ENCOUNTER — Encounter: Payer: Self-pay | Admitting: Internal Medicine

## 2018-09-08 DIAGNOSIS — R200 Anesthesia of skin: Secondary | ICD-10-CM | POA: Insufficient documentation

## 2018-09-08 NOTE — Assessment & Plan Note (Signed)
He is at risk for both carpal tunnel and cervical spinal stenosis given his occupational overuse .  Plain films ordered,  If normal will refer to neurology for EMG/Ledbetter testing

## 2018-09-08 NOTE — Assessment & Plan Note (Addendum)
Previously managed by a local Pain clinic,  Now unfortunately managed with illicit use of suboxone. Needs hepatic function panel,  Follow up one week.  Consider cymbalta

## 2018-09-08 NOTE — Assessment & Plan Note (Signed)
Refilling xyzal,  Intolerant of prednisone

## 2018-09-12 ENCOUNTER — Ambulatory Visit (INDEPENDENT_AMBULATORY_CARE_PROVIDER_SITE_OTHER): Payer: Self-pay | Admitting: Internal Medicine

## 2018-09-12 ENCOUNTER — Other Ambulatory Visit: Payer: Self-pay

## 2018-09-12 DIAGNOSIS — M159 Polyosteoarthritis, unspecified: Secondary | ICD-10-CM

## 2018-09-12 DIAGNOSIS — F411 Generalized anxiety disorder: Secondary | ICD-10-CM

## 2018-09-12 DIAGNOSIS — R200 Anesthesia of skin: Secondary | ICD-10-CM

## 2018-09-12 DIAGNOSIS — R748 Abnormal levels of other serum enzymes: Secondary | ICD-10-CM

## 2018-09-12 DIAGNOSIS — J301 Allergic rhinitis due to pollen: Secondary | ICD-10-CM

## 2018-09-12 NOTE — Assessment & Plan Note (Addendum)
HE HAS NO EVIDENCE OF CERVICAL DISK DISEASE BY PLAIN FILMS OF C SPINE DONE LAST WEEK.  He would like to deferEMG/[place] studies at this time since symptoms are improving
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
## 2018-09-14 NOTE — Assessment & Plan Note (Signed)
Advised to add daily saline irrigations given occupational exposure to irritants and continue Xyzal/flonase

## 2018-09-14 NOTE — Assessment & Plan Note (Signed)
I commended patient for his candor regarding his past abuse of controlled substances .  I assured him that he will not receive opiates or analogues from me.   We can initiate a trial of  celebrex , diclofenac or relafen if otc aleve is not adequate

## 2018-09-14 NOTE — Assessment & Plan Note (Signed)
Noted in 2017 but lost to follow up.  Repeat levels are normal.   Lab Results  Component Value Date   ALT 28 09/06/2018   AST 23 09/06/2018   ALKPHOS 72 09/06/2018   BILITOT 0.5 09/06/2018

## 2018-09-14 NOTE — Assessment & Plan Note (Signed)
Advised to continue cymbalta,  Chosen for management of anxiety and chronic pain .  20 mg dose to start, can increase monthly if needed

## 2018-09-25 ENCOUNTER — Encounter: Payer: Self-pay | Admitting: Emergency Medicine

## 2018-09-25 ENCOUNTER — Ambulatory Visit: Payer: Self-pay

## 2018-09-25 ENCOUNTER — Emergency Department: Payer: PRIVATE HEALTH INSURANCE

## 2018-09-25 ENCOUNTER — Other Ambulatory Visit: Payer: Self-pay

## 2018-09-25 ENCOUNTER — Emergency Department
Admission: EM | Admit: 2018-09-25 | Discharge: 2018-09-25 | Disposition: A | Discharge status: Home / Self Care | Payer: PRIVATE HEALTH INSURANCE | Attending: Emergency Medicine | Admitting: Emergency Medicine

## 2018-09-25 DIAGNOSIS — Z7982 Long term (current) use of aspirin: Secondary | ICD-10-CM | POA: Insufficient documentation

## 2018-09-25 DIAGNOSIS — Z79899 Other long term (current) drug therapy: Secondary | ICD-10-CM | POA: Insufficient documentation

## 2018-09-25 DIAGNOSIS — W01198A Fall on same level from slipping, tripping and stumbling with subsequent striking against other object, initial encounter: Secondary | ICD-10-CM | POA: Insufficient documentation

## 2018-09-25 DIAGNOSIS — S0990XA Unspecified injury of head, initial encounter: Secondary | ICD-10-CM | POA: Diagnosis not present

## 2018-09-25 DIAGNOSIS — Y990 Civilian activity done for income or pay: Secondary | ICD-10-CM | POA: Insufficient documentation

## 2018-09-25 DIAGNOSIS — R569 Unspecified convulsions: Secondary | ICD-10-CM | POA: Insufficient documentation

## 2018-09-25 DIAGNOSIS — R202 Paresthesia of skin: Secondary | ICD-10-CM | POA: Diagnosis not present

## 2018-09-25 DIAGNOSIS — Y9259 Other trade areas as the place of occurrence of the external cause: Secondary | ICD-10-CM | POA: Insufficient documentation

## 2018-09-25 DIAGNOSIS — J45909 Unspecified asthma, uncomplicated: Secondary | ICD-10-CM | POA: Insufficient documentation

## 2018-09-25 DIAGNOSIS — Y9389 Activity, other specified: Secondary | ICD-10-CM | POA: Insufficient documentation

## 2018-09-25 LAB — COMPREHENSIVE METABOLIC PANEL
ALT: 26 U/L (ref 0–44)
AST: 29 U/L (ref 15–41)
Albumin: 4.4 g/dL (ref 3.5–5.0)
Alkaline Phosphatase: 74 U/L (ref 38–126)
Anion gap: 10 (ref 5–15)
BUN: 9 mg/dL (ref 6–20)
CO2: 25 mmol/L (ref 22–32)
Calcium: 9.3 mg/dL (ref 8.9–10.3)
Chloride: 102 mmol/L (ref 98–111)
Creatinine, Ser: 0.85 mg/dL (ref 0.61–1.24)
GFR calc Af Amer: >60 mL/min (ref >60)
GFR calc non Af Amer: >60 mL/min (ref >60)
Glucose, Bld: 94 mg/dL (ref 70–99)
Potassium: 3.6 mmol/L (ref 3.5–5.1)
Sodium: 137 mmol/L (ref 135–145)
Total Bilirubin: 0.7 mg/dL (ref 0.3–1.2)
Total Protein: 7.5 g/dL (ref 6.5–8.1)

## 2018-09-25 LAB — URINE DRUG SCREEN, QUALITATIVE (ARMC ONLY)
Amphetamines, Ur Screen: NOT DETECTED
Barbiturates, Ur Screen: NOT DETECTED
Benzodiazepine, Ur Scrn: NOT DETECTED
Cannabinoid 50 Ng, Ur ~~LOC~~: NOT DETECTED
Cocaine Metabolite,Ur ~~LOC~~: NOT DETECTED
MDMA (Ecstasy)Ur Screen: NOT DETECTED
Methadone Scn, Ur: NOT DETECTED
Opiate, Ur Screen: NOT DETECTED
Phencyclidine (PCP) Ur S: NOT DETECTED
Tricyclic, Ur Screen: NOT DETECTED

## 2018-09-25 LAB — CBC WITH DIFFERENTIAL/PLATELET
Abs Immature Granulocytes: 0.03 10*3/uL (ref 0.00–0.07)
Basophils Absolute: 0.1 10*3/uL (ref 0.0–0.1)
Basophils Relative: 1 %
Eosinophils Absolute: 0.1 10*3/uL (ref 0.0–0.5)
Eosinophils Relative: 1 %
HCT: 42.2 % (ref 39.0–52.0)
Hemoglobin: 14.3 g/dL (ref 13.0–17.0)
Immature Granulocytes: 0 %
Lymphocytes Relative: 22 %
Lymphs Abs: 1.6 10*3/uL (ref 0.7–4.0)
MCH: 31 pg (ref 26.0–34.0)
MCHC: 33.9 g/dL (ref 30.0–36.0)
MCV: 91.5 fL (ref 80.0–100.0)
Monocytes Absolute: 0.6 10*3/uL (ref 0.1–1.0)
Monocytes Relative: 8 %
Neutro Abs: 5 10*3/uL (ref 1.7–7.7)
Neutrophils Relative %: 68 %
Platelets: 257 10*3/uL (ref 150–400)
RBC: 4.61 MIL/uL (ref 4.22–5.81)
RDW: 13.2 % (ref 11.5–15.5)
WBC: 7.4 10*3/uL (ref 4.0–10.5)
nRBC: 0 % (ref 0.0–0.2)

## 2018-09-25 NOTE — Addendum Note (Signed)
Addended by: Sherlene Shams on: 09/25/2018 12:15 PM   Modules accepted: Orders

## 2018-09-25 NOTE — ED Provider Notes (Signed)
[facility] Emergency Department Provider Note  ____________________________________________  Time seen: Approximately 2:11 PM  I have reviewed the triage vital signs and the nursing notes.   HISTORY  Chief Complaint Seizures   HPI Kyle Marshall is a 28 y.o. male with a history of seizure disorder who presents for evaluation of paresthesias of his extremities.  Patient reports that he has had this for several months if not years.  He did have a seizure 4 days ago while at work.  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  He fell backwards and hit his head.  He was not seen at that time.  Patient recently established care with the PCP after being without insurance for a long time.  He reports that he continued to have the paresthesias and because he is paying attention more often to them he became concerned about it.  He called his PCP who recommended he come into the emergency room for a head CT due to the recent trauma.  He denies any headache or neck pain.  Patient reports that he has had roughly 10 seizures his entire life.  He had one this past Friday and before that his last seizure was about 7 months ago.  He has never seen a neurologist due to lack of insurance however has an appointment coming up in a week to establish care with neurology.  He has never been on antiepileptic medication.  He denies any drug use.  Past Medical History:  Diagnosis Date  . Abdominal pain 02/12/2008  . Allergic rhinitis 12/08/2014  . Asthma   . Difficulty hearing 02/09/2015    Patient Active Problem List   Diagnosis Date Noted  . Bilateral hand numbness 09/08/2018  . Obesity 02/13/2016  . Knee pain, left 08/26/2015  . OA (osteoarthritis) of knee 08/26/2015  . Elevated liver enzymes 07/08/2015  . Elevated blood pressure reading without diagnosis of hypertension 06/06/2015  . Generalized anxiety disorder 06/06/2015  . Generalized OA 06/06/2015  . History of syncope 06/06/2015  . Chronic  pain 12/08/2014  . Asthma 12/08/2014  . Seasonal allergic rhinitis 12/08/2014    Past Surgical History:  Procedure Laterality Date  . EAR TUBE REMOVAL    . SHOULDER SURGERY Right 2012    Prior to Admission medications   Medication Sig Start Date End Date Taking? Authorizing Provider  acetaminophen (TYLENOL) 500 MG tablet Take 500 mg by mouth every 8 (eight) hours as needed.    [provider]  albuterol (PROVENTIL HFA;VENTOLIN HFA) 108 (90 BASE) MCG/ACT inhaler Inhale 2 puffs into the lungs every 4 (four) hours as needed for wheezing or shortness of breath. Reported on 08/26/2015    [provider]  Aspirin-Salicylamide-Caffeine (BC HEADACHE POWDER PO) Take by mouth as needed. Reported on 08/26/2015    [provider]  DULoxetine (CYMBALTA) 20 MG capsule Take 1 capsule (20 mg total) by mouth at bedtime. 09/05/18   Sherlene Shamsullo, Teresa L, MD  esomeprazole (NEXIUM) 40 MG capsule Take 40 mg by mouth daily at 12 noon. Reported on 08/26/2015    [provider]  fluticasone (FLONASE) 50 MCG/ACT nasal spray Place 2 sprays into both nostrils daily. 09/05/18   Sherlene Shamsullo, Teresa L, MD  Ibuprofen (EQ IBUPROFEN) 200 MG CAPS Take 4 capsules (800 mg total) by mouth 3 (three) times daily as needed. 03/18/15   Lorie PhenixMaloney, Nancy, MD  levocetirizine (XYZAL) 5 MG tablet Take 1 tablet (5 mg total) by mouth every evening. 09/05/18   Darrick Huntsmanullo,  Mar Daring, MD  loperamide (IMODIUM A-D) 2 MG tablet Take 1 tablet (2 mg total) by mouth 4 (four) times daily as needed for diarrhea or loose stools. Patient not taking: Reported on 09/12/2018 08/10/16   Minna Antis, MD    Allergies Vicodin [hydrocodone-acetaminophen]  Family History  Problem Relation Age of Onset  . Cancer Mother        breast  . Atrial fibrillation Mother   . Cancer Father        brain, lung     Social History Social History   Tobacco Use  . Smoking status: Former Games developer  . Smokeless tobacco: Never Used  Substance Use Topics   . Alcohol use: Yes    Alcohol/week: 0.0 standard drinks    Comment: seldom  . Drug use: Yes    Types: Marijuana, Oxycodone    Review of Systems  Constitutional: Negative for fever. Eyes: Negative for visual changes. ENT: Negative for sore throat. Neck: No neck pain  Cardiovascular: Negative for chest pain. Respiratory: Negative for shortness of breath. Gastrointestinal: Negative for abdominal pain, vomiting or diarrhea. Genitourinary: Negative for dysuria. Musculoskeletal: Negative for back pain. Skin: Negative for rash. Neurological: Negative for headaches, weakness or numbness. + Paresthesias of his extremities and seizure Psych: No SI or HI  ____________________________________________   PHYSICAL EXAM:  VITAL SIGNS: ED Triage Vitals  Enc Vitals Group     BP 09/25/18 1233 (!) 161/81     Pulse Rate 09/25/18 1233 72     Resp 09/25/18 1233 16     Temp 09/25/18 1233 98.3 F (36.8 C)     Temp Source 09/25/18 1233 Oral     SpO2 09/25/18 1233 100 %     Weight 09/25/18 1234 180 lb (81.6 kg)     Height 09/25/18 1234  (1.676 m)     Head Circumference --      Peak Flow --      Pain Score 09/25/18 1234 4     Pain Loc --      Pain Edu? --      Excl. in GC? --     Constitutional: Alert and oriented. Well appearing and in no apparent distress. HEENT:      Head: Normocephalic and atraumatic.         Eyes: Conjunctivae are normal. Sclera is non-icteric.       Ears: No hemotympanum      Mouth/Throat: Mucous membranes are moist.       Neck: Supple with no signs of meningismus.  No C-spine tenderness Cardiovascular: Regular rate and rhythm. No murmurs, gallops, or rubs. 2+ symmetrical distal pulses are present in all extremities. No JVD. Respiratory: Normal respiratory effort. Lungs are clear to auscultation bilaterally. No wheezes, crackles, or rhonchi.  Gastrointestinal: Soft, non tender, and non distended with positive bowel sounds. No rebound or guarding.  Musculoskeletal: Nontender with normal range of motion in all extremities. No edema, cyanosis, or erythema of extremities. Neurologic: Normal speech and language. Face is symmetric. Moving all extremities. No gross focal neurologic deficits are appreciated. Skin: Skin is warm, dry and intact. No rash noted. Psychiatric: Mood and affect are normal. Speech and behavior are normal.  ____________________________________________   LABS (all labs ordered are listed, but only abnormal results are displayed)  Labs Reviewed  CBC WITH DIFFERENTIAL/PLATELET  COMPREHENSIVE METABOLIC PANEL  URINE DRUG SCREEN, QUALITATIVE ([facility] ONLY)   ____________________________________________  EKG  none ____________________________________________  RADIOLOGY  I have personally reviewed the images performed  during this visit and I agree with the Radiologist's read.   Interpretation by Radiologist:  Ct Head Wo Contrast  Result Date: 09/25/2018 CLINICAL DATA:  Seizure. EXAM: CT HEAD WITHOUT CONTRAST TECHNIQUE: Contiguous axial images were obtained from the base of the skull through the vertex without intravenous contrast. COMPARISON:  None. FINDINGS: Brain: No evidence of acute infarction, hemorrhage, hydrocephalus, extra-axial collection or mass lesion/mass effect. Vascular: No hyperdense vessel or unexpected calcification. Skull: Normal. Negative for fracture or focal lesion. Sinuses/Orbits: No acute finding. Other: None. IMPRESSION: Normal head CT. Electronically Signed   By: Lupita Raider M.D.   On: 09/25/2018 13:39     ____________________________________________   PROCEDURES  Procedure(s) performed: None Procedures Critical Care performed:  None ____________________________________________   INITIAL IMPRESSION / ASSESSMENT AND PLAN / ED COURSE   27 y.o. male with a history of seizure disorder who presents for evaluation of paresthesias of his extremities, seizure 4 days ago with head trauma.   Patient is well-appearing, neurologically intact, extremities are warm and well-perfused with normal pulses, normal muscle tone.  Head CT was done due to recent trauma and that is normal.  Labs showing no abnormalities. Utox negative.  I discussed with the patient the importance of seeing a neurologist and offered to start him on an antiepileptic medication since he has had close to 10 seizures his whole life.  Patient prefers to hold off until he sees a neurologist for which she has an appointment within a week.  I discussed very strict seizure precautions with patient including not driving until 6 months seizure-free and cleared by his neurologist.  Patient understands the precautions.  Also discussed my standard return precautions with him.  At this time he is cleared for discharge per      As part of my medical decision making, I reviewed the following data within the electronic MEDICAL RECORD NUMBER Nursing notes reviewed and incorporated, Labs reviewed , Old chart reviewed, Radiograph reviewed , Notes from prior ED visits and [place] Controlled Substance Database    Pertinent labs & imaging results that were available during my care of the patient were reviewed by me and considered in my medical decision making (see chart for details).    ____________________________________________   FINAL CLINICAL IMPRESSION(S) / ED DIAGNOSES  Final diagnoses:  Seizure (HCC)  Paresthesias      NEW MEDICATIONS STARTED DURING THIS VISIT:  ED Discharge Orders    None       Note:  This document was prepared using Dragon voice recognition software and may include unintentional dictation errors.    Don Perking, [place], MD 09/25/18 [phone]

## 2018-09-25 NOTE — Telephone Encounter (Signed)
Patient had seizure on Friday at work fell and hit his head, lasted 5 -7 minutes, says this seizure was much different than he has been having nauseated and Emesis during and after seizure, more jerking then normal, wife witnessed seizure, sweating really bad and "turned really red vessels looked constricted in head, like they were going to pop" patient came to but was very nauseated for hours after and very tired and could not stay awake. Since the seizure he has been having increased numbness in hands and feet "feels like lightning bolt running down my legs." Ask  Patient if he had a neurologist he said no, advised patient that I felt he needs to be evaluated at the ER where an MRI or CT can be performed and further testing to make sure he does not have any swelling or injury to his head, patient says he feels everything has intensified since seizure.   Patient stated he would go to the ER, I advised once he is discharged to call the office sowe can schedule follow up care.

## 2018-09-25 NOTE — Telephone Encounter (Signed)
Agree .  Patient needs to see a neurologist and have an MRI of brain.  Both have been ordered  I nthe event that he does not go to the ER as directed

## 2018-09-25 NOTE — Discharge Instructions (Addendum)
Seizures may happen at any time. It is important to take certain precautions to maintain your safety.  ° °Follow up with your doctor in 1-3 days. ° °If you were started on a seizure medication, take it as prescribed. ° °During a seizure, a person may injure himself or herself. Seizure precautions are guidelines that a person can follow in order to minimize injury during a seizure. For any activity, it is important to ask, "What would happen if I had a seizure while doing this?" Follow the below precautions.  ° °Bathroom Safety  °A person with seizures may want to shower instead of bathe to avoid accidental drowning. If falls occur during the patient's typical seizure, a person should use a shower seat, preferably one with a safety strap.  °Use nonskid strips in your shower or tub.  °Never use electrical equipment near water. This prevents accidental electrocution.  °Consider changing glass in shower doors to shatterproof glass. ° °Kitchen Safety °If possible, cook when someone else is nearby.  °Use the back burners of the stove to prevent accidental burns.  °Use shatterproof containers as much as possible. For instance, sauces can be transferred from glass bottles to plastic containers for use.  °Limit time that is required using knives or other sharp objects. If possible, buy foods that are already cut, or ask someone to help in meal preparation.  ° °General Safety at Home °Do not smoke or light fires in the fireplace unless someone else is present.  °Do not use space heaters that can be accidentally overturned.  °When alone, avoid using step stools or ladders, and do not clean rooftop gutters.  °Purchase power tools and motorized lawn equipment which have a safety switch that will stop the machine if you release the handle (a 'dead man's' switch).  ° °Driving and Transportation °DO NOT DRIVE UNTIL YOU ARE CLEARED BY A NEUROLOGIST and/or you have permission to drive from your state's Department of Motor Vehicles   °(DMV). Each state has different laws. Please refer to the following link on the Epile[employer] Foundation of [employer]'s website for more information: http://www.epilepsyfoundation.org/answerplace/Social/driving/drivingu.cfm  °If you ride a bicycle, wear a helmet and any other necessary protective gear.  °When taking public transportation like the bus or subway, stay clear of the platform edge.  ° °Outdoor and Sports Safety °Swimming is okay, but does present certain risks. Never swim alone, and tell friends what to do if you have a seizure while swimming.  °Wear appropriate protective equipment.  °Ski with a friend. If a seizure occurs, your friend can seek help, if needed. He or she can also help to get you out of the cold. Consider using a safety hook or belt while riding the ski lift.  ° ° °

## 2018-09-25 NOTE — ED Triage Notes (Signed)
Pt here with c/o seizure on Friday, states today his hands and legs feel tingly and weak, this has happened before with previous seizures, pt walked to triage with no issues, states increase in stress in his life recently. NAD. States previous seizures but hasn't been diagnosed with them.

## 2018-09-25 NOTE — ED Triage Notes (Signed)
States on Friday he hit his head with the fall from seizure, and has shoulder pain from fall as well.

## 2018-09-25 NOTE — ED Notes (Signed)
Pt in NAD at time of departure, VSS, pt ambulatory and wife is waiting in lobby per pt. PT verbalizes d/c understanding and follow up

## 2018-09-25 NOTE — Telephone Encounter (Addendum)
Patient called and says that he had a seizure at work on last Friday around 1430, hit his head and shoulder. He says after the seizure he went back to work and finished his shift. He says he had a headache for a few days after. He says he's calling because he has numbness and tingling in his feet and legs up to his knees, which he says has been an ongoing issue for him along with numbness of his hands. He says since the seizure, the feet and leg numbness has gotten worse to the point that he says he feels shocks like voltage going up to his knee when he takes steps. He says he just wanted it on record that this has happened. I asked about other symptoms, he says just a loss of appetite that has been ongoing. I advised he will need a virtual visit with a provider, he says that is fine. I called the office and spoke to Olegario Messier, [place] who asked to speak to the patient, the call was connected successfully to [place].   Answer Assessment - Initial Assessment Questions 1. SYMPTOM: "What is the main symptom you are concerned about?" (e.g., weakness, numbness)     Tingling to both legs and feet 2. ONSET: "When did this start?" (minutes, hours, days; while sleeping)     Gotten worse since Friday 3. LAST NORMAL: "When was the last time you were normal (no symptoms)?"     Always feel numb 4. PATTERN "Does this come and go, or has it been constant since it started?"  "Is it present now?"     Constant  5. CARDIAC SYMPTOMS: "Have you had any of the following symptoms: chest pain, difficulty breathing, palpitations?"     No 6. NEUROLOGIC SYMPTOMS: "Have you had any of the following symptoms: headache, dizziness, vision loss, double vision, changes in speech, unsteady on your feet?"     Seizure on Friday and hit head, headache for several days after seizure 7. OTHER SYMPTOMS: "Do you have any other symptoms?"     Loss of appetite 8. PREGNANCY: "Is there any chance you are pregnant?" "When was your last menstrual  period?"     N/A  Protocols used: NEUROLOGIC DEFICIT-A-AH

## 2018-09-26 ENCOUNTER — Telehealth: Payer: Self-pay

## 2018-09-26 NOTE — Telephone Encounter (Signed)
The letter has been printed, signed and given to [facility]

## 2018-09-26 NOTE — Telephone Encounter (Signed)
Spoke with pt to let him know that the letter is ready for pick up. The pt stated that he would have his wife come by and pick it up.

## 2018-09-26 NOTE — Telephone Encounter (Signed)
Pt is asking if he could go back to work on light duty because he can no longer afford to be out of work.  He has a f/up with you on 10/11/2018 and he is going to keep the neurology appt when scheduled and he understands that he cannot drive as well.  Pt states he needs a note and to return to work tomorrow.  Nina,cma

## 2018-10-03 ENCOUNTER — Ambulatory Visit: Payer: PRIVATE HEALTH INSURANCE

## 2018-10-11 ENCOUNTER — Other Ambulatory Visit: Payer: Self-pay

## 2018-10-11 ENCOUNTER — Encounter: Payer: Self-pay | Admitting: Internal Medicine

## 2018-10-11 ENCOUNTER — Ambulatory Visit (INDEPENDENT_AMBULATORY_CARE_PROVIDER_SITE_OTHER): Payer: PRIVATE HEALTH INSURANCE | Admitting: Internal Medicine

## 2018-10-11 DIAGNOSIS — R569 Unspecified convulsions: Secondary | ICD-10-CM

## 2018-10-11 DIAGNOSIS — G894 Chronic pain syndrome: Secondary | ICD-10-CM

## 2018-10-11 DIAGNOSIS — F1911 Other psychoactive substance abuse, in remission: Secondary | ICD-10-CM | POA: Diagnosis not present

## 2018-10-11 HISTORY — DX: Unspecified convulsions: R56.9

## 2018-10-11 NOTE — Patient Instructions (Addendum)
I'm so Glad to see you doing so well!     Since you like music,  I  Recommend a songwriter by the name of Brunilda Payor.  She's a Ephriam Knuckles,  But struggles with her own issues  And is one of the best (most honest) songwriters the [place] music genre has ever seen  We will set up Friday meetings

## 2018-10-11 NOTE — Progress Notes (Signed)
Virtual Visit via [email or web address]  This visit type was conducted due to national recommendations for restrictions regarding the COVID-19 pandemic (e.g. social distancing).  This format is felt to be most appropriate for this patient at this time.  All issues noted in this document were discussed and addressed.  No physical exam was performed (except for noted visual exam findings with Video Visits).   I connected with@ on 10/11/18 at 10:00 AM EDT by a video enabled telemedicine application or telephone and verified that I am speaking with the correct person using two identifiers. Location patient: home Location provider: work or home office Persons participating in the virtual visit: patient, provider  I discussed the limitations, risks, security and privacy concerns of performing an evaluation and management service by telephone and the availability of in person appointments. I also discussed with the patient that there may be a patient responsible charge related to this service. The patient expressed understanding and agreed to proceed.  Reason for visit: follow up on seizure like activity,  Substance abuse   HPI:    Referred to Dr Malvin JohnsPotter after ER evaluation for recurrent witnessed  seizures.  Advised that his seziures were likely related to drug withdrawal.  Has been opiate, suboxine and benzodiazepine free for 14 days.  First 6-7 days were "rough" but now he is sleeping better,  Eating better and feels more clear headed  Than he has "in years." has narrowed his social circle , deleted all the contacts used to obtain illicit drugs from his phone.  He is considering attending NA.  Keeping a nightly journal as advised by Malvin JohnsPotter.  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  EEG planned,  MRI cancelled by Potter.    ROS: See pertinent positives and negatives per HPI.  Past Medical History:  Diagnosis Date  . Abdominal pain 02/12/2008  . Allergic rhinitis 12/08/2014  . Asthma    . Difficulty hearing 02/09/2015    Past Surgical History:  Procedure Laterality Date  . EAR TUBE REMOVAL    . SHOULDER SURGERY Right 2012    Family History  Problem Relation Age of Onset  . Cancer Mother        breast  . Atrial fibrillation Mother   . Cancer Father        brain, lung     SOCIAL HX:  Social History   Tobacco Use  . Smoking status: Former Games developermoker  . Smokeless tobacco: Never Used  Substance Use Topics  . Alcohol use: Not Currently    Alcohol/week: 0.0 standard drinks    Comment: seldom  . Drug use: Not Currently    Types: Marijuana, Oxycodone, Benzodiazepines     Current Outpatient Medications:  .  acetaminophen (TYLENOL) 500 MG tablet, Take 500 mg by mouth every 8 (eight) hours as needed., Disp: , Rfl:  .  albuterol (PROVENTIL HFA;VENTOLIN HFA) 108 (90 BASE) MCG/ACT inhaler, Inhale 2 puffs into the lungs every 4 (four) hours as needed for wheezing or shortness of breath. Reported on 08/26/2015, Disp: , Rfl:  .  Aspirin-Salicylamide-Caffeine (BC HEADACHE POWDER PO), Take by mouth as needed. Reported on 08/26/2015, Disp: , Rfl:  .  esomeprazole (NEXIUM) 40 MG capsule, Take 40 mg by mouth daily at 12 noon. Reported on 08/26/2015, Disp: , Rfl:  .  fluticasone (FLONASE) 50 MCG/ACT nasal spray, Place 2 sprays into both nostrils daily., Disp: 16 g, Rfl: 6 .  Ibuprofen (EQ IBUPROFEN) 200 MG CAPS, Take 4 capsules (800  mg total) by mouth 3 (three) times daily as needed., Disp: 1 each, Rfl: 0 .  levocetirizine (XYZAL) 5 MG tablet, Take 1 tablet (5 mg total) by mouth every evening., Disp: 90 tablet, Rfl: 1 .  DULoxetine (CYMBALTA) 20 MG capsule, Take 1 capsule (20 mg total) by mouth at bedtime. (Patient not taking: Reported on 10/11/2018), Disp: 30 capsule, Rfl: 3 .  loperamide (IMODIUM A-D) 2 MG tablet, Take 1 tablet (2 mg total) by mouth 4 (four) times daily as needed for diarrhea or loose stools. (Patient not taking: Reported on 09/12/2018), Disp: 20 tablet, Rfl: 0  Current  Facility-Administered Medications:  .  bupivacaine (MARCAINE) 0.5 % injection 10 mL, 10 mL, Infiltration, Once, Tod Persia, MD  EXAM:  VITALS per patient if applicable:  GENERAL: alert, oriented, appears well and in no acute distress  HEENT: atraumatic, conjunttiva clear, no obvious abnormalities on inspection of external nose and ears  NECK: normal movements of the head and neck  LUNGS: on inspection no signs of respiratory distress, breathing rate appears normal, no obvious gross SOB, gasping or wheezing  CV: no obvious cyanosis  MS: moves all visible extremities without noticeable abnormality  PSYCH/NEURO: pleasant and cooperative, no obvious depression or anxiety, speech and thought processing grossly intact  ASSESSMENT AND PLAN:  Discussed the following assessment and plan:  Chronic pain syndrome  Witnessed seizure-like activity (HCC)  Substance abuse in remission (HCC)  Chronic pain Previously managed by a local Pain clinic, and until recently managed with illicit use of suboxone and opiates.  He has been drug free for 2 weeks and reports improved and dramatically decreased pain issues . follow up tow weeks.  Continue cymbalta   Witnessed seizure-like activity [facility]) Patient states that he has a history of seizure disorder as a child.  Recent witnessed events in the setting of sleep deprivation and drug abuse .  He has been evaluated by neurology and EEG has been ordered.  He has been abstinent for 2 weeks from all controlled substances and has not had another episode.  Neurology has cleared him to return to work and driving IF SUPERVISED   Substance abuse in remission (HCC) CONTINUE WEEKLY follow up alternating between Neurology and me.  Encouraged strongly to attend NA    I discussed the assessment and treatment plan with the patient. The patient was provided an opportunity to ask questions and all were answered. The patient agreed with the plan and demonstrated  an understanding of the instructions.   The patient was advised to call back or seek an in-person evaluation if the symptoms worsen or if the condition fails to improve as anticipated.  I provided 25 minutes of non-face-to-face time during this encounter.   Sherlene Shams, MD

## 2018-10-14 ENCOUNTER — Encounter: Payer: Self-pay | Admitting: Internal Medicine

## 2018-10-14 DIAGNOSIS — R569 Unspecified convulsions: Secondary | ICD-10-CM

## 2018-10-14 DIAGNOSIS — F1911 Other psychoactive substance abuse, in remission: Secondary | ICD-10-CM | POA: Insufficient documentation

## 2018-10-14 HISTORY — DX: Unspecified convulsions: R56.9

## 2018-10-14 NOTE — Assessment & Plan Note (Signed)
Previously managed by a local Pain clinic, and until recently managed with illicit use of suboxone and opiates.  He has been drug free for 2 weeks and reports improved and dramatically decreased pain issues . follow up tow weeks.  Continue cymbalta

## 2018-10-14 NOTE — Assessment & Plan Note (Signed)
CONTINUE WEEKLY follow up alternating between Neurology and me.  Encouraged strongly to attend NA 

## 2018-10-14 NOTE — Assessment & Plan Note (Signed)
Patient states that he has a history of seizure disorder as a child.  Recent witnessed events in the setting of sleep deprivation and drug abuse .  He has been evaluated by neurology and EEG has been ordered.  He has been abstinent for 2 weeks from all controlled substances and has not had another episode.  Neurology has cleared him to return to work and driving IF SUPERVISED

## 2018-10-19 ENCOUNTER — Encounter: Payer: Self-pay | Admitting: Internal Medicine

## 2018-10-19 ENCOUNTER — Ambulatory Visit (INDEPENDENT_AMBULATORY_CARE_PROVIDER_SITE_OTHER): Payer: PRIVATE HEALTH INSURANCE | Admitting: Internal Medicine

## 2018-10-19 ENCOUNTER — Other Ambulatory Visit: Payer: Self-pay

## 2018-10-19 DIAGNOSIS — F1911 Other psychoactive substance abuse, in remission: Secondary | ICD-10-CM

## 2018-10-19 NOTE — Progress Notes (Signed)
Virtual Visit via [email or web address]  This visit type was conducted due to national recommendations for restrictions regarding the COVID-19 pandemic (e.g. social distancing).  This format is felt to be most appropriate for this patient at this time.  All issues noted in this document were discussed and addressed.  No physical exam was performed (except for noted visual exam findings with Video Visits).   I connected with@ on 10/19/18 at  4:00 PM EDT by a video enabled telemedicine application and verified that I am speaking with the correct person using two identifiers. Location patient: home Location provider: work or home office Persons participating in the virtual visit: patient, provider  I discussed the limitations, risks, security and privacy concerns of performing an evaluation and management service by telephone and the availability of in person appointments. I also discussed with the patient that there may be a patient responsible charge related to this service. The patient expressed understanding and agreed to proceed.  Reason for visit: opiate abuse and addiction with seizure like activity  HPI:  Follow up on  Opioid abuse and seizure disorder .  Patient states that he  had a rough day on Wednesday ,  Tired,  Irritable,  Achey, old habits trying to surface.  used iburpfen and tyleno for pian  Not sleeping well .chronic since childhood.  using advil PM No energy .  Regardless,he has maintained his  sobriety.   ROS: See pertinent positives and negatives per HPI.  Past Medical History:  Diagnosis Date  . Abdominal pain 02/12/2008  . Allergic rhinitis 12/08/2014  . Asthma   . Difficulty hearing 02/09/2015    Past Surgical History:  Procedure Laterality Date  . EAR TUBE REMOVAL    . SHOULDER SURGERY Right 2012    Family History  Problem Relation Age of Onset  . Cancer Mother        breast  . Atrial fibrillation Mother   . Cancer Father        brain, lung     SOCIAL HX: married,   Employed celebrating 22 days of sobriety/abstinence    Current Outpatient Medications:  .  acetaminophen (TYLENOL) 500 MG tablet, Take 500 mg by mouth every 8 (eight) hours as needed., Disp: , Rfl:  .  albuterol (PROVENTIL HFA;VENTOLIN HFA) 108 (90 BASE) MCG/ACT inhaler, Inhale 2 puffs into the lungs every 4 (four) hours as needed for wheezing or shortness of breath. Reported on 08/26/2015, Disp: , Rfl:  .  Aspirin-Salicylamide-Caffeine (BC HEADACHE POWDER PO), Take by mouth as needed. Reported on 08/26/2015, Disp: , Rfl:  .  esomeprazole (NEXIUM) 40 MG capsule, Take 40 mg by mouth daily at 12 noon. Reported on 08/26/2015, Disp: , Rfl:  .  fluticasone (FLONASE) 50 MCG/ACT nasal spray, Place 2 sprays into both nostrils daily., Disp: 16 g, Rfl: 6 .  Ibuprofen (EQ IBUPROFEN) 200 MG CAPS, Take 4 capsules (800 mg total) by mouth 3 (three) times daily as needed., Disp: 1 each, Rfl: 0 .  levocetirizine (XYZAL) 5 MG tablet, Take 1 tablet (5 mg total) by mouth every evening., Disp: 90 tablet, Rfl: 1  Current Facility-Administered Medications:  .  bupivacaine (MARCAINE) 0.5 % injection 10 mL, 10 mL, Infiltration, Once, Tod PersiaParris, Winston, MD  EXAMTheodoro Kos:  VITALS per patient if applicable:  GENERAL: alert, oriented, appears well and in no acute distress  HEENT: atraumatic, conjunttiva clear, no obvious abnormalities on inspection of external nose and ears  NECK: normal movements of the head and neck  LUNGS: on  inspection no signs of respiratory distress, breathing rate appears normal, no obvious gross SOB, gasping or wheezing  CV: no obvious cyanosis  MS: moves all visible extremities without noticeable abnormality  PSYCH/NEURO: pleasant and cooperative, no obvious depression or anxiety, speech and thought processing grossly intact  ASSESSMENT AND PLAN:  Discussed the following assessment and plan:  No diagnosis found.  No problem-specific Assessment & Plan notes found for this encounter.    I  discussed the assessment and treatment plan with the patient. The patient was provided an opportunity to ask questions and all were answered. The patient agreed with the plan and demonstrated an understanding of the instructions.   The patient was advised to call back or seek an in-person evaluation if the symptoms worsen or if the condition fails to improve as anticipated.  I provided 25 minutes of non-face-to-face time during this encounter.   Sherlene Shams, MD

## 2018-10-21 NOTE — Assessment & Plan Note (Signed)
CONTINUE WEEKLY follow up alternating between Neurology and me.  Encouraged strongly to attend NA

## 2018-10-26 ENCOUNTER — Other Ambulatory Visit: Payer: Self-pay

## 2018-10-26 ENCOUNTER — Ambulatory Visit (INDEPENDENT_AMBULATORY_CARE_PROVIDER_SITE_OTHER): Payer: PRIVATE HEALTH INSURANCE | Admitting: Internal Medicine

## 2018-10-26 ENCOUNTER — Encounter: Payer: Self-pay | Admitting: Internal Medicine

## 2018-10-26 DIAGNOSIS — F411 Generalized anxiety disorder: Secondary | ICD-10-CM | POA: Diagnosis not present

## 2018-10-26 DIAGNOSIS — F1911 Other psychoactive substance abuse, in remission: Secondary | ICD-10-CM
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
## 2018-10-28 NOTE — Assessment & Plan Note (Addendum)
CONTINUE WEEKLY follow up meetings  With me. For now until he has been established with a psychologist  (referral made today)and is attending  NA. Cautioned about the psychological dependence that can occur with marijuana.  encouraged to consider resuming Cymbalta

## 2018-10-28 NOTE — Patient Instructions (Signed)
I strongly recommend attending a weekly NA meeting  I have made a referral to a psychologist  Please consider resuming Cymbalta to help manage your multiple symptoms of expression/anxiety/pain

## 2018-11-02 ENCOUNTER — Ambulatory Visit (INDEPENDENT_AMBULATORY_CARE_PROVIDER_SITE_OTHER): Payer: PRIVATE HEALTH INSURANCE | Admitting: Internal Medicine

## 2018-11-02 ENCOUNTER — Encounter: Payer: Self-pay | Admitting: Internal Medicine

## 2018-11-02 ENCOUNTER — Other Ambulatory Visit: Payer: Self-pay

## 2018-11-02 DIAGNOSIS — F1911 Other psychoactive substance abuse, in remission: Secondary | ICD-10-CM

## 2018-11-02 NOTE — Progress Notes (Signed)
Virtual Visit via [email or web address]  This visit type was conducted due to national recommendations for restrictions regarding the COVID-19 pandemic (e.g. social distancing).  This format is felt to be most appropriate for this patient at this time.  All issues noted in this document were discussed and addressed.  No physical exam was performed (except for noted visual exam findings with Video Visits).   I connected with@ on 11/02/18 at  4:00 PM EDT by a video enabled telemedicine application or telephone and verified that I am speaking with the correct person using two identifiers. Location patient: home Location provider: work or home office Persons participating in the virtual visit: patient, provider  I discussed the limitations, risks, security and privacy concerns of performing an evaluation and management service by telephone and the availability of in person appointments. I also discussed with the patient that there may be a patient responsible charge related to this service. The patient expressed understanding and agreed to proceed.   Reason for visit: follow up on polysubstance abuse   HPI:  28 yr old male with history of polysubstance abuse , currently abstinent since having a seizure pseudoseizure on May 5. Here for weekly follow up .  He continues to maintain abstinence and states that he is feeing better each day. He has occasional drug cravings that he is managing without pills. He is following a healthier diet and drinking more water.  His wife is very supportive and he is very grateful that he did not lose his family as a result of his drug seeking behavior. He is sleeping better,  And has has started exercising .   ROS: See pertinent positives and negatives per HPI.  Past Medical History:  Diagnosis Date  . Abdominal pain 02/12/2008  . Allergic rhinitis 12/08/2014  . Asthma   . Difficulty hearing 02/09/2015    Past Surgical History:  Procedure Laterality Date  . EAR TUBE REMOVAL     . SHOULDER SURGERY Right 2012    Family History  Problem Relation Age of Onset  . Cancer Mother        breast  . Atrial fibrillation Mother   . Cancer Father        brain, lung     SOCIAL HX:  reports that he has quit smoking. He has never used smokeless tobacco. He reports previous alcohol use. He reports previous drug use. Drugs: Marijuana, Oxycodone, and Benzodiazepines.  Current Outpatient Medications:  .  acetaminophen (TYLENOL) 500 MG tablet, Take 500 mg by mouth every 8 (eight) hours as needed., Disp: , Rfl:  .  albuterol (PROVENTIL HFA;VENTOLIN HFA) 108 (90 BASE) MCG/ACT inhaler, Inhale 2 puffs into the lungs every 4 (four) hours as needed for wheezing or shortness of breath. Reported on 08/26/2015, Disp: , Rfl:  .  Aspirin-Salicylamide-Caffeine (BC HEADACHE POWDER PO), Take by mouth as needed. Reported on 08/26/2015, Disp: , Rfl:  .  esomeprazole (NEXIUM) 40 MG capsule, Take 40 mg by mouth daily at 12 noon. Reported on 08/26/2015, Disp: , Rfl:  .  fluticasone (FLONASE) 50 MCG/ACT nasal spray, Place 2 sprays into both nostrils daily., Disp: 16 g, Rfl: 6 .  Ibuprofen (EQ IBUPROFEN) 200 MG CAPS, Take 4 capsules (800 mg total) by mouth 3 (three) times daily as needed., Disp: 1 each, Rfl: 0 .  levocetirizine (XYZAL) 5 MG tablet, Take 1 tablet (5 mg total) by mouth every evening., Disp: 90 tablet, Rfl: 1  Current Facility-Administered Medications:  .  bupivacaine (MARCAINE) 0.5 %  injection 10 mL, 10 mL, Infiltration, Once, Lance Bosch, MD  EXAM:  VITALS per patient if applicable:  GENERAL: alert, oriented, appears well and in no acute distress  HEENT: atraumatic, conjunttiva clear, no obvious abnormalities on inspection of external nose and ears  NECK: normal movements of the head and neck  LUNGS: on inspection no signs of respiratory distress, breathing rate appears normal, no obvious gross SOB, gasping or wheezing  CV: no obvious cyanosis  MS: moves all visible  extremities without noticeable abnormality  PSYCH/NEURO: pleasant and cooperative, no obvious depression or anxiety, speech and thought processing grossly intact  ASSESSMENT AND PLAN:  Discussed the following assessment and plan:  Substance abuse in remission [facility]) He has been abstinent since May 5. A total of 25 minutes of face to face time was spent with patient more than half of which was spent in counselling about the above mentioned conditions  and coordination of care  He will continue WEEKLY follow up meetings  With me. For now until he has been established with a psychologist  (referral made today).  Encouraged to attend NA    I discussed the assessment and treatment plan with the patient. The patient was provided an opportunity to ask questions and all were answered. The patient agreed with the plan and demonstrated an understanding of the instructions.   The patient was advised to call back or seek an in-person evaluation if the symptoms worsen or if the condition fails to improve as anticipated.  I provided 25 minutes of non-face-to-face time during this encounter.   Crecencio Mc, MD

## 2018-11-03 NOTE — Assessment & Plan Note (Signed)
He has been abstinent since May 5. A total of 25 minutes of face to face time was spent with patient more than half of which was spent in counselling about the above mentioned conditions  and coordination of care  He will continue WEEKLY follow up meetings  With me. For now until he has been established with a psychologist  (referral made today).  Encouraged to attend NA

## 2018-11-06 ENCOUNTER — Telehealth: Payer: Self-pay

## 2018-11-06 MED ORDER — IBUPROFEN 800 MG PO TABS: 800.0000 mg | ORAL_TABLET | Freq: Three times a day (TID) | ORAL | 0 refills | Status: DC | PRN

## 2018-11-06 NOTE — Telephone Encounter (Signed)
800 mg ibuprofen sent to local pharmacy.  Tell him that he also Needs to take 650 mg tylenol every 8 hours with the ibuprofen
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
## 2018-11-08 NOTE — Telephone Encounter (Signed)
Called the pt and informed him

## 2018-11-09 ENCOUNTER — Ambulatory Visit (INDEPENDENT_AMBULATORY_CARE_PROVIDER_SITE_OTHER): Payer: PRIVATE HEALTH INSURANCE | Admitting: Internal Medicine

## 2018-11-09 DIAGNOSIS — S92191A Other fracture of right talus, initial encounter for closed fracture: Secondary | ICD-10-CM

## 2018-11-09 DIAGNOSIS — G8921 Chronic pain due to trauma: Secondary | ICD-10-CM

## 2018-11-09 DIAGNOSIS — F1911 Other psychoactive substance abuse, in remission: Secondary | ICD-10-CM

## 2018-11-09 DIAGNOSIS — S62001A Unspecified fracture of navicular [scaphoid] bone of right wrist, initial encounter for closed fracture: Secondary | ICD-10-CM | POA: Diagnosis not present
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
## 2018-11-11 DIAGNOSIS — S62009A Unspecified fracture of navicular [scaphoid] bone of unspecified wrist, initial encounter for closed fracture: Secondary | ICD-10-CM | POA: Insufficient documentation

## 2018-11-11 DIAGNOSIS — S92191A Other fracture of right talus, initial encounter for closed fracture: Secondary | ICD-10-CM | POA: Insufficient documentation
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
## 2018-11-11 NOTE — Assessment & Plan Note (Signed)
For surgery on Monday at [facility]
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
## 2018-11-16 ENCOUNTER — Other Ambulatory Visit: Payer: Self-pay

## 2018-11-16 ENCOUNTER — Ambulatory Visit (INDEPENDENT_AMBULATORY_CARE_PROVIDER_SITE_OTHER): Payer: PRIVATE HEALTH INSURANCE | Admitting: Internal Medicine

## 2018-11-16 ENCOUNTER — Encounter: Payer: Self-pay | Admitting: Internal Medicine

## 2018-11-16 DIAGNOSIS — T07XXXA Unspecified multiple injuries, initial encounter: Secondary | ICD-10-CM

## 2018-11-16 DIAGNOSIS — F1911 Other psychoactive substance abuse, in remission: Secondary | ICD-10-CM

## 2018-11-16 DIAGNOSIS — R569 Unspecified convulsions: Secondary | ICD-10-CM | POA: Diagnosis not present
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
## 2018-11-18 DIAGNOSIS — T07XXXA Unspecified multiple injuries, initial encounter: Secondary | ICD-10-CM | POA: Insufficient documentation

## 2018-11-18 NOTE — Assessment & Plan Note (Signed)
Wound care outlined including use of non stick telfa,  Saline for cleansing and avoidance of hydrogen peroxide and antimicrobial ointment

## 2018-11-18 NOTE — Assessment & Plan Note (Signed)
None since early May prior to abstinence.

## 2018-11-18 NOTE — Assessment & Plan Note (Addendum)
He has chosen to avoid narcotics post operatively and is using tylenol and motrin.  Encouragement given.  He has been abstinent since May 5

## 2018-11-22 ENCOUNTER — Ambulatory Visit: Payer: PRIVATE HEALTH INSURANCE | Admitting: Internal Medicine

## 2018-11-26 ENCOUNTER — Ambulatory Visit (INDEPENDENT_AMBULATORY_CARE_PROVIDER_SITE_OTHER): Payer: PRIVATE HEALTH INSURANCE | Admitting: Psychology

## 2018-11-26 DIAGNOSIS — F411 Generalized anxiety disorder: Secondary | ICD-10-CM | POA: Diagnosis not present

## 2018-11-26 DIAGNOSIS — F1110 Opioid abuse, uncomplicated: Secondary | ICD-10-CM

## 2018-11-30 ENCOUNTER — Other Ambulatory Visit: Payer: Self-pay

## 2018-11-30 ENCOUNTER — Encounter: Payer: Self-pay | Admitting: Internal Medicine

## 2018-11-30 ENCOUNTER — Ambulatory Visit (INDEPENDENT_AMBULATORY_CARE_PROVIDER_SITE_OTHER): Payer: PRIVATE HEALTH INSURANCE | Admitting: Internal Medicine

## 2018-11-30 DIAGNOSIS — F1911 Other psychoactive substance abuse, in remission: Secondary | ICD-10-CM | POA: Diagnosis not present
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
## 2018-12-01 ENCOUNTER — Other Ambulatory Visit: Payer: Self-pay | Admitting: Internal Medicine

## 2018-12-02 ENCOUNTER — Encounter: Payer: Self-pay | Admitting: Internal Medicine

## 2018-12-02 NOTE — Assessment & Plan Note (Addendum)
He has chosen to avoid narcotics post operatively and is using tylenol and motrin.  Encouragement given.  He has been abstinent since May 5 .  A total of 25 minutes of face to face time was spent with patient more than half of which was spent in counselling about the above mentioned conditions  and coordination of care  

## 2018-12-06 ENCOUNTER — Ambulatory Visit (INDEPENDENT_AMBULATORY_CARE_PROVIDER_SITE_OTHER): Payer: PRIVATE HEALTH INSURANCE | Admitting: Psychology

## 2018-12-06 DIAGNOSIS — F411 Generalized anxiety disorder: Secondary | ICD-10-CM

## 2018-12-06 DIAGNOSIS — F1110 Opioid abuse, uncomplicated: Secondary | ICD-10-CM | POA: Diagnosis not present

## 2018-12-07 ENCOUNTER — Ambulatory Visit: Payer: PRIVATE HEALTH INSURANCE | Admitting: Internal Medicine

## 2018-12-10 ENCOUNTER — Ambulatory Visit (INDEPENDENT_AMBULATORY_CARE_PROVIDER_SITE_OTHER): Payer: PRIVATE HEALTH INSURANCE | Admitting: Psychology

## 2018-12-10 DIAGNOSIS — F1110 Opioid abuse, uncomplicated: Secondary | ICD-10-CM

## 2018-12-10 DIAGNOSIS — F411 Generalized anxiety disorder: Secondary | ICD-10-CM | POA: Diagnosis not present

## 2018-12-13 ENCOUNTER — Encounter: Payer: Self-pay | Admitting: Internal Medicine

## 2018-12-13 ENCOUNTER — Ambulatory Visit (INDEPENDENT_AMBULATORY_CARE_PROVIDER_SITE_OTHER): Payer: PRIVATE HEALTH INSURANCE | Admitting: Internal Medicine

## 2018-12-13 ENCOUNTER — Other Ambulatory Visit: Payer: Self-pay

## 2018-12-13 DIAGNOSIS — F1911 Other psychoactive substance abuse, in remission: Secondary | ICD-10-CM | POA: Diagnosis not present

## 2018-12-13 DIAGNOSIS — S62001A Unspecified fracture of navicular [scaphoid] bone of right wrist, initial encounter for closed fracture: Secondary | ICD-10-CM

## 2018-12-13 DIAGNOSIS — S92191A Other fracture of right talus, initial encounter for closed fracture: Secondary | ICD-10-CM

## 2018-12-13 DIAGNOSIS — R569 Unspecified convulsions: Secondary | ICD-10-CM

## 2018-12-13 MED ORDER — FLUTICASONE PROPIONATE 50 MCG/ACT NA SUSP: 2.0000 | Freq: Every day | NASAL | 6 refills | Status: DC

## 2018-12-13 MED ORDER — LEVOCETIRIZINE DIHYDROCHLORIDE 5 MG PO TABS: 5.0000 mg | ORAL_TABLET | Freq: Every evening | ORAL | 1 refills | Status: DC
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
## 2018-12-16 NOTE — Assessment & Plan Note (Signed)
Successful open reduction internal fixation of a right displaced scaphoid fracture utilizing a size 20 mini Arthrex cannulated screw. There was some volar radial comminution to the scaphoid. We were able to reduce these fragments and compressed them with our cannulated screw.  

## 2018-12-16 NOTE — Assessment & Plan Note (Signed)
Secondary to polysubstance abuse.  He has had no recurrent symptoms since May  With abstinence.

## 2018-12-16 NOTE — Assessment & Plan Note (Signed)
Successful open reduction internal fixation of a right displaced scaphoid fracture utilizing a size 20 mini Arthrex cannulated screw. There was some volar radial comminution to the scaphoid. We were able to reduce these fragments and compressed them with our cannulated screw.

## 2018-12-16 NOTE — Assessment & Plan Note (Signed)
He has chosen to avoid narcotics post operatively and is using tylenol and motrin.  Encouragement given.  He has been abstinent since May 5 .  A total of 25 minutes of face to face time was spent with patient more than half of which was spent in counselling about the above mentioned conditions  and coordination of care

## 2018-12-24 ENCOUNTER — Ambulatory Visit: Payer: Self-pay | Admitting: Psychology

## 2018-12-28 ENCOUNTER — Ambulatory Visit (INDEPENDENT_AMBULATORY_CARE_PROVIDER_SITE_OTHER): Payer: PRIVATE HEALTH INSURANCE | Admitting: Internal Medicine

## 2018-12-28 ENCOUNTER — Encounter: Payer: Self-pay | Admitting: Internal Medicine

## 2018-12-28 DIAGNOSIS — F1911 Other psychoactive substance abuse, in remission: Secondary | ICD-10-CM
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
## 2018-12-28 NOTE — Patient Instructions (Signed)
Try SalonPas patches with 4% lidocaine

## 2018-12-29 ENCOUNTER — Other Ambulatory Visit: Payer: Self-pay | Admitting: Internal Medicine

## 2018-12-30 NOTE — Assessment & Plan Note (Signed)
He has chosen to avoid narcotics post operatively and is using tylenol and motrin.  Encouragement given.  He has been abstinent since May 5 .  A total of 25 minutes of face to face time was spent with patient more than half of which was spent in counselling about the above mentioned conditions  and coordination of care  

## 2019-01-02 ENCOUNTER — Ambulatory Visit: Payer: PRIVATE HEALTH INSURANCE | Admitting: Psychology

## 2019-01-17 ENCOUNTER — Ambulatory Visit: Payer: PRIVATE HEALTH INSURANCE | Admitting: Psychology

## 2019-01-24 ENCOUNTER — Ambulatory Visit: Payer: PRIVATE HEALTH INSURANCE | Admitting: Psychology

## 2019-02-04 ENCOUNTER — Other Ambulatory Visit: Payer: Self-pay | Admitting: Internal Medicine

## 2019-02-27 ENCOUNTER — Other Ambulatory Visit: Payer: Self-pay | Admitting: Internal Medicine

## 2019-04-06 ENCOUNTER — Other Ambulatory Visit: Payer: Self-pay | Admitting: Internal Medicine

## 2019-06-17 ENCOUNTER — Encounter: Payer: Self-pay | Admitting: Internal Medicine

## 2019-06-17 ENCOUNTER — Ambulatory Visit (INDEPENDENT_AMBULATORY_CARE_PROVIDER_SITE_OTHER): Payer: PRIVATE HEALTH INSURANCE | Admitting: Internal Medicine

## 2019-06-17 ENCOUNTER — Other Ambulatory Visit: Payer: Self-pay

## 2019-06-17 DIAGNOSIS — Z20822 Contact with and (suspected) exposure to covid-19: Secondary | ICD-10-CM

## 2019-06-17 HISTORY — DX: Contact with and (suspected) exposure to covid-19: Z20.822

## 2019-06-17 NOTE — Assessment & Plan Note (Signed)
Patient's course is highly suspicious for COVID 19 infection regardless of the negative test .  Advised him to remain in quarantine through Tuesday, return to work on Wednesday half time until Monday  Letter written.

## 2019-06-17 NOTE — Progress Notes (Signed)
Virtual Visit via [email or web address]  This visit type was conducted due to national recommendations for restrictions regarding the COVID-19 pandemic (e.g. social distancing).  This format is felt to be most appropriate for this patient at this time.  All issues noted in this document were discussed and addressed.  No physical exam was performed (except for noted visual exam findings with Video Visits).   I connected with@ on 06/17/19 at  8:00 AM EST by a video enabled telemedicine application  and verified that I am speaking with the correct person using two identifiers. Location patient: car Location provider: work or home office Persons participating in the virtual visit: patient, provider  I discussed the limitations, risks, security and privacy concerns of performing an evaluation and management service by telephone and the availability of in person appointments. I also discussed with the patient that there may be a patient responsible charge related to this service. The patient expressed understanding and agreed to proceed.   Reason for visit:fatigue, diarrhea,  Leg pain   HPI:  29 yr old male with history of multiple orthopedic fractures, managed without opioids due to history of opioid abuse presents after having a prolonged illness that started 12 days ago with persistent occipital headache, sore throat and ear pressure, and chills and sweats . Takes tylenol on a regular basis for management of chronic pain.  Symptoms progressed to including vomiting, diarrhea fatigue,  Anorexia and intense body aches.  Was tested for COVID 19 ON jan 13/14 and test was negative. However symptoms persisted until yesterday.  Feeling better today except for fatigue and anorexia,  Chills and body aches have improved and stools are semi formed now. His wife also became ill about 2 days after he became symptomatic and is also recovering uneventfully .  ROS: See pertinent positives and negatives per HPI.  Past Medical  History:  Diagnosis Date  . Abdominal pain 02/12/2008  . Allergic rhinitis 12/08/2014  . Asthma   . Difficulty hearing 02/09/2015    Past Surgical History:  Procedure Laterality Date  . EAR TUBE REMOVAL    . SHOULDER SURGERY Right 2012    Family History  Problem Relation Age of Onset  . Cancer Mother        breast  . Atrial fibrillation Mother   . Cancer Father        brain, lung     SOCIAL HX: . reports that he has quit smoking. He has never used smokeless tobacco. He reports previous alcohol use. He reports current drug use. Drug: Marijuana.  Current Outpatient Medications:  .  acetaminophen (TYLENOL) 500 MG tablet, Take 500 mg by mouth every 8 (eight) hours as needed., Disp: , Rfl:  .  albuterol (PROVENTIL HFA;VENTOLIN HFA) 108 (90 BASE) MCG/ACT inhaler, Inhale 2 puffs into the lungs every 4 (four) hours as needed for wheezing or shortness of breath. Reported on 08/26/2015, Disp: , Rfl:  .  Aspirin-Salicylamide-Caffeine (BC HEADACHE POWDER PO), Take by mouth as needed. Reported on 08/26/2015, Disp: , Rfl:  .  esomeprazole (NEXIUM) 40 MG capsule, Take 40 mg by mouth daily at 12 noon. Reported on 08/26/2015, Disp: , Rfl:  .  fluticasone (FLONASE) 50 MCG/ACT nasal spray, Place 2 sprays into both nostrils daily., Disp: 16 g, Rfl: 6 .  ibuprofen (ADVIL) 800 MG tablet, TAKE 1 TABLET BY MOUTH EVERY 8 HOURS AS NEEDED, Disp: 90 tablet, Rfl: 0 .  levocetirizine (XYZAL) 5 MG tablet, Take 1 tablet (5 mg total) by mouth every  evening., Disp: 90 tablet, Rfl: 1 .  ondansetron (ZOFRAN) 4 MG tablet, Take 4 mg by mouth every 8 (eight) hours as needed for nausea or vomiting. As needed for nausea., Disp: , Rfl:   Current Facility-Administered Medications:  .  bupivacaine (MARCAINE) 0.5 % injection 10 mL, 10 mL, Infiltration, Once, Lance Bosch, MD  EXAM:  VITALS per patient if applicable:  GENERAL: alert, oriented, appears well and in no acute distress  HEENT: atraumatic, conjunttiva clear, no  obvious abnormalities on inspection of external nose and ears  NECK: normal movements of the head and neck  LUNGS: on inspection no signs of respiratory distress, breathing rate appears normal, no obvious gross SOB, gasping or wheezing  CV: no obvious cyanosis  MS: moves all visible extremities without noticeable abnormality  PSYCH/NEURO: pleasant and cooperative, no obvious depression or anxiety, speech and thought processing grossly intact  ASSESSMENT AND PLAN:  Discussed the following assessment and plan:  Suspected COVID-19 virus infection  Suspected COVID-19 virus infection Patient's course is highly suspicious for COVID 19 infection regardless of the negative test .  Advised him to remain in quarantine through Tuesday, return to work on Wednesday half time until Monday  Letter written.     I discussed the assessment and treatment plan with the patient. The patient was provided an opportunity to ask questions and all were answered. The patient agreed with the plan and demonstrated an understanding of the instructions.   The patient was advised to call back or seek an in-person evaluation if the symptoms worsen or if the condition fails to improve as anticipated.  I provided 20 minutes of non-face-to-face time during this encounter.   Crecencio Mc, MD

## 2019-10-09 ENCOUNTER — Other Ambulatory Visit: Payer: Self-pay

## 2019-10-09 ENCOUNTER — Emergency Department: Payer: PRIVATE HEALTH INSURANCE

## 2019-10-09 ENCOUNTER — Telehealth: Payer: Self-pay | Admitting: Internal Medicine

## 2019-10-09 ENCOUNTER — Emergency Department
Admission: EM | Admit: 2019-10-09 | Discharge: 2019-10-09 | Disposition: A | Discharge status: Home / Self Care | Payer: PRIVATE HEALTH INSURANCE | Attending: Emergency Medicine | Admitting: Emergency Medicine

## 2019-10-09 ENCOUNTER — Encounter: Payer: Self-pay | Admitting: Emergency Medicine

## 2019-10-09 DIAGNOSIS — S060X1A Concussion with loss of consciousness of 30 minutes or less, initial encounter: Secondary | ICD-10-CM | POA: Diagnosis not present

## 2019-10-09 DIAGNOSIS — Y999 Unspecified external cause status: Secondary | ICD-10-CM | POA: Insufficient documentation

## 2019-10-09 DIAGNOSIS — Y929 Unspecified place or not applicable: Secondary | ICD-10-CM | POA: Diagnosis not present

## 2019-10-09 DIAGNOSIS — Z87891 Personal history of nicotine dependence: Secondary | ICD-10-CM | POA: Insufficient documentation

## 2019-10-09 DIAGNOSIS — S0990XA Unspecified injury of head, initial encounter: Secondary | ICD-10-CM | POA: Insufficient documentation

## 2019-10-09 DIAGNOSIS — Z79899 Other long term (current) drug therapy: Secondary | ICD-10-CM | POA: Diagnosis not present

## 2019-10-09 DIAGNOSIS — Y939 Activity, unspecified: Secondary | ICD-10-CM | POA: Insufficient documentation

## 2019-10-09 DIAGNOSIS — R630 Anorexia: Secondary | ICD-10-CM | POA: Diagnosis not present

## 2019-10-09 DIAGNOSIS — W01198A Fall on same level from slipping, tripping and stumbling with subsequent striking against other object, initial encounter: Secondary | ICD-10-CM | POA: Insufficient documentation

## 2019-10-09 DIAGNOSIS — J45909 Unspecified asthma, uncomplicated: Secondary | ICD-10-CM | POA: Diagnosis not present

## 2019-10-09 MED ORDER — METOCLOPRAMIDE HCL 10 MG PO TABS: 10.0000 mg | ORAL_TABLET | Freq: Three times a day (TID) | ORAL | 0 refills | Status: DC | PRN

## 2019-10-09 MED ORDER — METOCLOPRAMIDE HCL 10 MG PO TABS
10.0000 mg | ORAL_TABLET | Freq: Once | ORAL | Status: AC
  Administered 2019-10-09: 10 mg via ORAL
  Filled 2019-10-09: qty 1

## 2019-10-09 NOTE — ED Provider Notes (Signed)
[facility] Emergency Department Provider Note ____________________________________________   First MD Initiated Contact with Patient 10/09/19 1210     (approximate)  I have reviewed the triage vital signs and the nursing notes.   HISTORY  Chief Complaint Head Injury    Kyle Marshall is a 29 y.o. male with PMH as noted below who presents with a head injury 4 days ago, acute onset, occurring when he states he believes he slipped and fell, hitting the back of his head on the ground.  He apparently immediately stood up and was conscious, however he does not clearly remember what happened so believes he may have briefly lost consciousness.  He states that since that time he has developed a moderately severe headache especially to the back of his head, associated with photophobia, nausea, decreased appetite, and lightheadedness.  He has also had some intermittent moments of feeling like his vision is blurred.  He has taken some Zofran without much relief.  He denies vomiting, fever chills, abdominal pain, or confusion.  He has no neck pain.  Past Medical History:  Diagnosis Date  . Abdominal pain 02/12/2008  . Allergic rhinitis 12/08/2014  . Asthma   . Difficulty hearing 02/09/2015    Patient Active Problem List   Diagnosis Date Noted  . Suspected COVID-19 virus infection 06/17/2019  . Closed traumatic nondisplaced fracture of scaphoid 11/11/2018  . Other fracture of right talus, initial encounter for closed fracture 11/11/2018  . Witnessed seizure-like activity (HCC) 10/14/2018  . Substance abuse in remission (HCC) 10/14/2018  . Bilateral hand numbness 09/08/2018  . Obesity 02/13/2016  . OA (osteoarthritis) of knee 08/26/2015  . Elevated blood pressure reading without diagnosis of hypertension 06/06/2015  . Generalized anxiety disorder 06/06/2015  . Generalized OA 06/06/2015  . History of syncope 06/06/2015  . Chronic pain 12/08/2014  . Asthma  12/08/2014  . Seasonal allergic rhinitis 12/08/2014    Past Surgical History:  Procedure Laterality Date  . EAR TUBE REMOVAL    . SHOULDER SURGERY Right 2012    Prior to Admission medications   Medication Sig Start Date End Date Taking? Authorizing Provider  acetaminophen (TYLENOL) 500 MG tablet Take 500 mg by mouth every 8 (eight) hours as needed.    [provider]  albuterol (PROVENTIL HFA;VENTOLIN HFA) 108 (90 BASE) MCG/ACT inhaler Inhale 2 puffs into the lungs every 4 (four) hours as needed for wheezing or shortness of breath. Reported on 08/26/2015    [provider]  Aspirin-Salicylamide-Caffeine (BC HEADACHE POWDER PO) Take by mouth as needed. Reported on 08/26/2015    [provider]  esomeprazole (NEXIUM) 40 MG capsule Take 40 mg by mouth daily at 12 noon. Reported on 08/26/2015    [provider]  fluticasone (FLONASE) 50 MCG/ACT nasal spray Place 2 sprays into both nostrils daily. 12/13/18   Sherlene Shams, MD  ibuprofen (ADVIL) 800 MG tablet TAKE 1 TABLET BY MOUTH EVERY 8 HOURS AS NEEDED 04/08/19   Sherlene Shams, MD  levocetirizine (XYZAL) 5 MG tablet Take 1 tablet (5 mg total) by mouth every evening. 12/13/18   Sherlene Shams, MD  metoCLOPramide (REGLAN) 10 MG tablet Take 1 tablet (10 mg total) by mouth every 8 (eight) hours as needed for up to 5 days for nausea. 10/09/19 10/14/19  Dionne Bucy, MD  ondansetron (ZOFRAN) 4 MG tablet Take 4 mg by mouth every 8 (eight) hours as needed for nausea or vomiting. As needed for nausea.  [provider]    Allergies Vicodin [hydrocodone-acetaminophen]  Family History  Problem Relation Age of Onset  . Cancer Mother        breast  . Atrial fibrillation Mother   . Cancer Father        brain, lung     Social History Social History   Tobacco Use  . Smoking status: Former Games developer  . Smokeless tobacco: Never Used  Substance Use Topics  . Alcohol use: Not Currently    Alcohol/week:  0.0 standard drinks    Comment: seldom  . Drug use: Yes    Types: Marijuana    Review of Systems  Constitutional: No fever/chills. Eyes: No positive for mild intermittent blurred vision, now resolved. ENT: No neck pain. Cardiovascular: Denies chest pain. Respiratory: Denies shortness of breath. Gastrointestinal: Positive for nausea. Genitourinary: Negative for flank pain. Musculoskeletal: Negative for back pain. Skin: Negative for rash. Neurological: Positive for headache.   ____________________________________________   PHYSICAL EXAM:  VITAL SIGNS: ED Triage Vitals  Enc Vitals Group     BP 10/09/19 0911 (!) 158/103     Pulse Rate 10/09/19 0911 71     Resp 10/09/19 0911 16     Temp 10/09/19 0911 98.7 F (37.1 C)     Temp Source 10/09/19 0911 Oral     SpO2 10/09/19 0911 99 %     Weight 10/09/19 0910 185 lb (83.9 kg)     Height 10/09/19 0910 5\' 6"  (1.676 m)     Head Circumference --      Peak Flow --      Pain Score 10/09/19 0910 10     Pain Loc --      Pain Edu? --      Excl. in GC? --     Constitutional: Alert and oriented.  Relatively well appearing and in no acute distress. Eyes: Conjunctivae are normal.  EOMI.  PERRLA.  Mild photophobia. Head: Atraumatic. Nose: No congestion/rhinnorhea. Mouth/Throat: Mucous membranes are moist.   Neck: Normal range of motion.  No meningeal signs.  No midline cervical spinal tenderness. Cardiovascular: Normal rate, regular rhythm.  Good peripheral circulation. Respiratory: Normal respiratory effort.  No retractions.  Gastrointestinal: No distention.  Musculoskeletal: Extremities warm and well perfused.  Neurologic:  Normal speech and language.  Motor and sensory intact in all extremities.  Normal coordination with no ataxia on finger-to-nose.  No pronator drift.  Normal gait. Skin:  Skin is warm and dry. No rash noted. Psychiatric: Mood and affect are normal. Speech and behavior are normal.   ____________________________________________   LABS (all labs ordered are listed, but only abnormal results are displayed)  Labs Reviewed - No data to display ____________________________________________  EKG   ____________________________________________  RADIOLOGY  CT head: No ICH or other acute traumatic findings  ____________________________________________   PROCEDURES  Procedure(s) performed: No  Procedures  Critical Care performed: No ____________________________________________   INITIAL IMPRESSION / ASSESSMENT AND PLAN / ED COURSE  Pertinent labs & imaging results that were available during my care of the patient were reviewed by me and considered in my medical decision making (see chart for details).  29 year old male with PMH as noted above presents with persistent headache, nausea, lightheadedness, and decreased appetite after head injury 4 days ago in which he may have had very brief LOC.  On exam the patient is overall well-appearing.  His vital signs are normal except for mild hypertension.  The physical exam is unremarkable.  Neurologic exam is normal.  He has no midline spinal tenderness.  CT head was obtained from triage and is negative for acute traumatic findings.  Overall presentation is consistent with a concussion.  I will give p.o. Reglan for symptomatic treatment and prescribed the same for home.  The patient is stable for discharge.  He is following up with his primary care doctor tomorrow.  I advised him on concussion precautions, and he expressed understanding.  ____________________________________________   FINAL CLINICAL IMPRESSION(S) / ED DIAGNOSES  Final diagnoses:  Concussion with loss of consciousness of 30 minutes or less, initial encounter      NEW MEDICATIONS STARTED DURING THIS VISIT:  Discharge Medication List as of 10/09/2019 12:44 PM    START taking these medications   Details  metoCLOPramide (REGLAN) 10 MG tablet Take  1 tablet (10 mg total) by mouth every 8 (eight) hours as needed for up to 5 days for nausea., Starting Wed 10/09/2019, Until Mon 10/14/2019, Normal         Note:  This document was prepared using Dragon voice recognition software and may include unintentional dictation errors.   Arta Silence, MD 10/09/19 1300

## 2019-10-09 NOTE — Discharge Instructions (Signed)
You can alternate between 600 mg of ibuprofen (Motrin, Advil) and 650 or 1000 mg of Tylenol for pain, and use the prescribed Reglan as needed for nausea.  Follow-up with your doctor tomorrow as scheduled.  Return to the ER for new, worsening, or persistent severe headache, vomiting, vision changes, or any other new or worsening symptoms that concern you.  Take extra care to avoid any recurrent head injury within the next 2 weeks.

## 2019-10-09 NOTE — ED Notes (Signed)
Pt reports falling Sunday; he isn't sure what caused him to fall. States hit head and LOC. Reports nausea, HA, blurred vision, and balance being off since hitting head. Pt calmly sitting in bed.

## 2019-10-09 NOTE — ED Notes (Signed)
EDP at bedside with pt.  

## 2019-10-09 NOTE — Telephone Encounter (Signed)
Patient was sent to ED. Due to severe headache, sensitivity to light, loss of appetite, nausea, and tender where head struck wall. Patient stated he has been taking ibuprofen and tylenol. Patient fell back and hit his head Sunday. He has appointment with PCP for follow up [id number]

## 2019-10-09 NOTE — ED Triage Notes (Signed)
Here for head injury. Fell back and tripped and hit head on wall Sunday.  + LOC per pt.  C/o severe headache since bringing him to tears. Intermittent blurry vision bilateral.  Ambulatory with steady gait.  Has felt fatigued since fall.

## 2019-10-09 NOTE — Telephone Encounter (Signed)
Pt called in said he hit head Sunday and loss of appetite,some upset [phone]

## 2019-10-09 NOTE — ED Notes (Signed)
Pt signed d/c printed paperwork. Girlfriend/wife ride home.

## 2019-10-10 ENCOUNTER — Encounter: Payer: Self-pay | Admitting: Nurse Practitioner

## 2019-10-10 ENCOUNTER — Ambulatory Visit (INDEPENDENT_AMBULATORY_CARE_PROVIDER_SITE_OTHER): Payer: PRIVATE HEALTH INSURANCE | Admitting: Nurse Practitioner

## 2019-10-10 ENCOUNTER — Ambulatory Visit: Payer: PRIVATE HEALTH INSURANCE | Admitting: Internal Medicine

## 2019-10-10 ENCOUNTER — Telehealth: Payer: Self-pay | Admitting: Family Medicine

## 2019-10-10 VITALS — BP 134/100 | HR 75 | Temp 98.2°F | Ht 66.0 in | Wt 181.8 lb

## 2019-10-10 DIAGNOSIS — S060X9D Concussion with loss of consciousness of unspecified duration, subsequent encounter: Secondary | ICD-10-CM | POA: Diagnosis not present

## 2019-10-10 DIAGNOSIS — S060X9A Concussion with loss of consciousness of unspecified duration, initial encounter: Secondary | ICD-10-CM | POA: Insufficient documentation

## 2019-10-10 DIAGNOSIS — I10 Essential (primary) hypertension: Secondary | ICD-10-CM | POA: Diagnosis not present

## 2019-10-10 HISTORY — DX: Concussion with loss of consciousness of unspecified duration, initial encounter: S06.0X9A

## 2019-10-10 MED ORDER — AMLODIPINE BESYLATE 2.5 MG PO TABS: 2.5000 mg | ORAL_TABLET | Freq: Every day | ORAL | 0 refills | Status: DC

## 2019-10-10 NOTE — Telephone Encounter (Signed)
Referral received requesting an appointment for a concussion visit from Amedeo Kinsman, NP [facility]). Please advise on scheduling.

## 2019-10-10 NOTE — Patient Instructions (Addendum)
It was nice to meet you today.   1. For High BP: Begin low dose amlodipine 2.5 mg one pill daily.  Office visit in 1 month to check the results. If you have a BP arm cuff at home, bring in home readings.  2. For Concussion: You have had  a diagnosis of concussion. A concussion is a brain injury that results from a hard, direct hit (trauma) to your head or body. Please take it easy. Tylenol per package directions is OK to take. Do not take more  Tylenol than recommended. Work note provided for today and tomorrow.   I have placed a referral to Sports Medicine Dr. Gardenia Phlegm who runs a concussion clinic for follow-up since you are having headaches.   Return to the our clinic next week if not improving.  Return to the ER for alarm signs below.   Contact a health care provider if:  Your symptoms get worse or they do not improve.  You have new symptoms.  You have another injury. Get help right away if:  You have severe or worsening headaches.  You have weakness or numbness in any part of your body.  You are confused.  Your coordination gets worse.  You vomit repeatedly.  You are sleepier than normal.  Your speech is slurred.  You cannot recognize people or places.  You have a seizure.  It is difficult to wake you up.  You have unusual behavior changes.  You have changes in your vision.  You lose consciousness.    Concussion, Adult  A concussion is a brain injury from a hard, direct hit (trauma) to the head or body. This direct hit causes the brain to shake quickly back and forth inside the skull. This can damage brain cells and cause chemical changes in the brain. A concussion may also be known as a mild traumatic brain injury (TBI). Concussions are usually not life-threatening, but the effects of a concussion can be serious. If you have a concussion, you should be very careful to avoid having a second concussion. What are the causes? This condition is caused by:  A  direct hit to your head, such as: ? Running into another player during a game. ? Being hit in a fight. ? Hitting your head on a hard surface.  Sudden movement of your body that causes your brain to move back and forth inside the skull, such as in a car crash. What are the signs or symptoms? The signs of a concussion can be hard to notice. Early on, they may be missed by you, family members, and health care providers. You may look fine on the outside but may act or feel differently. Symptoms are usually temporary and most often improve in 7-10 days. Some symptoms appear right away, but other symptoms may not show up for hours or days. If your symptoms last longer than normal, you may have post-concussion syndrome. Every head injury is different. Physical symptoms  Headaches. This can include a feeling of pressure in the head or migraine-like symptoms.  Tiredness (fatigue).  Dizziness.  Problems with coordination or balance.  Vision or hearing problems.  Sensitivity to light or noise.  Nausea or vomiting.  Changes in eating or sleeping patterns.  Numbness or tingling.  Seizure. Mental and emotional symptoms  Memory problems.  Trouble concentrating, organizing, or making decisions.  Slowness in thinking, acting or reacting, speaking, or reading.  Irritability or mood changes.  Anxiety or depression. How is this diagnosed?  This condition is diagnosed based on:  Your symptoms.  A description of your injury. You may also have tests, including:  Imaging tests, such as a CT scan or MRI.  Neuropsychological tests. These measure your thinking, understanding, learning, and remembering abilities. How is this treated? Treatment for this condition includes:  Stopping sports or activity if you are injured. If you hit your head or show signs of concussion: ? Do not return to sports or activities the same day. ? Get checked by a health care provider before you return to your  activities.  Physical and mental rest and careful observation, usually at home. Gradually return to your normal activities.  Medicines to help with symptoms such as headaches, nausea, or difficulty sleeping. ? Avoid taking opioid pain medicine while recovering from a concussion.  Avoiding alcohol and drugs. These may slow your recovery and can put you at risk of further injury.  Referral to a concussion clinic or rehabilitation center. Recovery from a concussion can take time. How fast you recover depends on many factors. Return to activities only when:  Your symptoms are completely gone.  Your health care provider says that it is safe. Follow these instructions at home: Activity  Limit activities that require a lot of thought or concentration, such as: ? Doing homework or job-related work. ? Watching TV. ? Working on the computer or phone. ? Playing memory games and puzzles.  Rest. Rest helps your brain heal. Make sure you: ? Get plenty of sleep. Most adults should get 7-9 hours of sleep each night. ? Rest during the day. Take naps or rest breaks when you feel tired.  Avoid physical activity like exercise until your health care provider says it is safe. Stop any activity that worsens symptoms.  Do not do high-risk activities that could cause a second concussion, such as riding a bike or playing sports.  Ask your health care provider when you can return to your normal activities, such as school, work, athletics, and driving. Your ability to react may be slower after a brain injury. Never do these activities if you are dizzy. Your health care provider will likely give you a plan for gradually returning to activities. General instructions   Take over-the-counter and prescription medicines only as told by your health care provider. Some medicines, such as blood thinners (anticoagulants) and aspirin, may increase the risk for complications, such as bleeding.  Do not drink alcohol  until your health care provider says you can.  Watch your symptoms and tell others around you to do the same. Complications sometimes occur after a concussion. Older adults with a brain injury may have a higher risk of serious complications.  Tell your work Production designer, theatre/television/filmmanager, teachers, Tax adviserschool nurse, school counselor, coach, or Event organiserathletic trainer about your injury, symptoms, and restrictions.  Keep all follow-up visits as told by your health care provider. This is important. How is this prevented? Avoiding another brain injury is very important. In rare cases, another injury can lead to permanent brain damage, brain swelling, or death. The risk of this is greatest during the first 7-10 days after a head injury. Avoid injuries by:  Stopping activities that could lead to a second concussion, such as contact or recreational sports, until your health care provider says it is okay.  Taking these actions once you have returned to sports or activities: ? Avoiding plays or moves that can cause you to crash into another person. This is how most concussions occur. ? Following the rules  and being respectful of other players. Do not engage in violent or illegal plays.  Getting regular exercise that includes strength and balance training.  Wearing a properly fitting helmet during sports, biking, or other activities. Helmets can help protect you from serious skull and brain injuries, but they do not protect you from a concussion. Even when wearing a helmet, you should avoid being hit in the head. Contact a health care provider if:  Your symptoms get worse or they do not improve.  You have new symptoms.  You have another injury. Get help right away if:  You have severe or worsening headaches.  You have weakness or numbness in any part of your body.  You are confused.  Your coordination gets worse.  You vomit repeatedly.  You are sleepier than normal.  Your speech is slurred.  You cannot recognize  people or places.  You have a seizure.  It is difficult to wake you up.  You have unusual behavior changes.  You have changes in your vision.  You lose consciousness. Summary  A concussion is a brain injury that results from a hard, direct hit (trauma) to your head or body.  You may have imaging tests and neuropsychological tests to diagnose a concussion.  Treatment for this condition includes physical and mental rest and careful observation.  Ask your health care provider when you can return to your normal activities, such as school, work, athletics, and driving.  Get help right away if you have a severe headache, weakness on one side of the body, seizures, behavior changes, changes in vision, or if you are confused or sleepier than normal. This information is not intended to replace advice given to you by your health care provider. Make sure you discuss any questions you have with your health care provider. Document Revised: 12/28/2017 Document Reviewed: 12/28/2017 Elsevier Patient Education  2020 [employer].   Hypertension, Adult High blood pressure (hypertension) is when the force of blood pumping through the arteries is too strong. The arteries are the blood vessels that carry blood from the heart throughout the body. Hypertension forces the heart to work harder to pump blood and may cause arteries to become narrow or stiff. Untreated or uncontrolled hypertension can cause a heart attack, heart failure, a stroke, kidney disease, and other problems. A blood pressure reading consists of a higher number over a lower number. Ideally, your blood pressure should be below 120/80. The first ("top") number is called the systolic pressure. It is a measure of the pressure in your arteries as your heart beats. The second ("bottom") number is called the diastolic pressure. It is a measure of the pressure in your arteries as the heart relaxes. What are the causes? The exact cause of this  condition is not known. There are some conditions that result in or are related to high blood pressure. What increases the risk? Some risk factors for high blood pressure are under your control. The following factors may make you more likely to develop this condition:  Smoking.  Having type 2 diabetes mellitus, high cholesterol, or both.  Not getting enough exercise or physical activity.  Being overweight.  Having too much fat, sugar, calories, or salt (sodium) in your diet.  Drinking too much alcohol. Some risk factors for high blood pressure may be difficult or impossible to change. Some of these factors include:  Having chronic kidney disease.  Having a family history of high blood pressure.  Age. Risk increases with age.  Race. You may be at higher risk if you are African American.  Gender. Men are at higher risk than women before age 74. After age 11, women are at higher risk than men.  Having obstructive sleep apnea.  Stress. What are the signs or symptoms? High blood pressure may not cause symptoms. Very high blood pressure (hypertensive crisis) may cause:  Headache.  Anxiety.  Shortness of breath.  Nosebleed.  Nausea and vomiting.  Vision changes.  Severe chest pain.  Seizures. How is this diagnosed? This condition is diagnosed by measuring your blood pressure while you are seated, with your arm resting on a flat surface, your legs uncrossed, and your feet flat on the floor. The cuff of the blood pressure monitor will be placed directly against the skin of your upper arm at the level of your heart. It should be measured at least twice using the same arm. Certain conditions can cause a difference in blood pressure between your right and left arms. Certain factors can cause blood pressure readings to be lower or higher than normal for a short period of time:  When your blood pressure is higher when you are in a health care provider's office than when you are  at home, this is called white coat hypertension. Most people with this condition do not need medicines.  When your blood pressure is higher at home than when you are in a health care provider's office, this is called masked hypertension. Most people with this condition may need medicines to control blood pressure. If you have a high blood pressure reading during one visit or you have normal blood pressure with other risk factors, you may be asked to:  Return on a different day to have your blood pressure checked again.  Monitor your blood pressure at home for 1 week or longer. If you are diagnosed with hypertension, you may have other blood or imaging tests to help your health care provider understand your overall risk for other conditions. How is this treated? This condition is treated by making healthy lifestyle changes, such as eating healthy foods, exercising more, and reducing your alcohol intake. Your health care provider may prescribe medicine if lifestyle changes are not enough to get your blood pressure under control, and if:  Your systolic blood pressure is above 130.  Your diastolic blood pressure is above 80. Your personal target blood pressure may vary depending on your medical conditions, your age, and other factors. Follow these instructions at home: Eating and drinking   Eat a diet that is high in fiber and potassium, and low in sodium, added sugar, and fat. An example eating plan is called the DASH (Dietary Approaches to Stop Hypertension) diet. To eat this way: ? Eat plenty of fresh fruits and vegetables. Try to fill one half of your plate at each meal with fruits and vegetables. ? Eat whole grains, such as whole-wheat pasta, brown rice, or whole-grain bread. Fill about one fourth of your plate with whole grains. ? Eat or drink low-fat dairy products, such as skim milk or low-fat yogurt. ? Avoid fatty cuts of meat, processed or cured meats, and poultry with skin. Fill about  one fourth of your plate with lean proteins, such as fish, chicken without skin, beans, eggs, or tofu. ? Avoid pre-made and processed foods. These tend to be higher in sodium, added sugar, and fat.  Reduce your daily sodium intake. Most people with hypertension should eat less than 1,500 mg of sodium a day.  Do not drink alcohol if: ? Your health care provider tells you not to drink. ? You are pregnant, may be pregnant, or are planning to become pregnant.  If you drink alcohol: ? Limit how much you use to:  0-1 drink a day for women.  0-2 drinks a day for men. ? Be aware of how much alcohol is in your drink. In the [place] one drink equals one 12 oz bottle of beer (355 mL), one 5 oz glass of wine (148 mL), or one 1 oz glass of hard liquor (44 mL). Lifestyle   Work with your health care provider to maintain a healthy body weight or to lose weight. Ask what an ideal weight is for you.  Get at least 30 minutes of exercise most days of the week. Activities may include walking, swimming, or biking.  Include exercise to strengthen your muscles (resistance exercise), such as Pilates or lifting weights, as part of your weekly exercise routine. Try to do these types of exercises for 30 minutes at least 3 days a week.  Do not use any products that contain nicotine or tobacco, such as cigarettes, e-cigarettes, and chewing tobacco. If you need help quitting, ask your health care provider.  Monitor your blood pressure at home as told by your health care provider.  Keep all follow-up visits as told by your health care provider. This is important. Medicines  Take over-the-counter and prescription medicines only as told by your health care provider. Follow directions carefully. Blood pressure medicines must be taken as prescribed.  Do not skip doses of blood pressure medicine. Doing this puts you at risk for problems and can make the medicine less effective.  Ask your health care provider about  side effects or reactions to medicines that you should watch for. Contact a health care provider if you:  Think you are having a reaction to a medicine you are taking.  Have headaches that keep coming back (recurring).  Feel dizzy.  Have swelling in your ankles.  Have trouble with your vision. Get help right away if you:  Develop a severe headache or confusion.  Have unusual weakness or numbness.  Feel faint.  Have severe pain in your chest or abdomen.  Vomit repeatedly.  Have trouble breathing. Summary  Hypertension is when the force of blood pumping through your arteries is too strong. If this condition is not controlled, it may put you at risk for serious complications.  Your personal target blood pressure may vary depending on your medical conditions, your age, and other factors. For most people, a normal blood pressure is less than 120/80.  Hypertension is treated with lifestyle changes, medicines, or a combination of both. Lifestyle changes include losing weight, eating a healthy, low-sodium diet, exercising more, and limiting alcohol. This information is not intended to replace advice given to you by your health care provider. Make sure you discuss any questions you have with your health care provider. Document Revised: 01/17/2018 Document Reviewed: 01/17/2018 Elsevier Patient Education  2020 [employer].

## 2019-10-10 NOTE — Progress Notes (Signed)
Established Patient Office Visit  Subjective:  Patient ID: Kyle Marshall, male    DOB: 1991/01/06  Age: 29 y.o. MRN: [id number]  CC:  Chief Complaint  Patient presents with  . Hospitalization Follow-up    concussion    HPI Kyle Marshall presents for an ED follow up of concussion sustained Sun eve. He was seen in the ED yesterday -10/09/19 and head CT unremarkable. He has a HA.   HPI: He had a party on Sunday and reports No ETOH or illicit drug use. He was dancing wildly and "knocked myself up good against the wall". He struck the right back of his head on the cell phone jack and popped up quickly. He does not remember the fall, but it was witnessed. This occurred at approx 1900 and he felt fine the ret of the night. He went to bed and slept all night.  He awoke on Monday and his nose was bleeding. This occurs 1-2 per year and he did not think much about it. His vision was blurred and he had a pounding HA bilateral frontal with photophobia. He had nausea.  This was the worst HA he could remember. He has had many broken bones and HA in the past. This HA persisted M>Wed and was rated severe 11 out of 10 scale-worst HA in his life. Head was tender where he struck the wall- no head bleeding. He had nausea and was not eating well over these 3 days. He went to the ED for head scan. He was Dx with concussion and given Reglan for nausea which helped. He tried to work this week and the noise, lights made his HA unbearable. He has been taking Tylenol and ibuprofen.   Today, he still has a forehead bilat pounding HA and rates it 7 out of 10 with eyelids heavy. Dull pounding HA worse if lean forward. Still light sensitive. He took 1 gram of Tylenol 0800 and 800 mg ibuprofen. He took one Percocet given to him by a friend and that helped some.He is resting and relaxes in a dark room. No seizure, neck pain, extremity numbness tingling or weakness.  He has less nausea, tolerating diet well.  No vomiting.   Headache is improving and he says he is about 25% improved.     History of opioid addiction: Remote seizure from Xanax abuse years ago.   HTN: Mild and BP 134/100- high for awhile and spoke to Dr. Derrel Nip about it. He is a hyper type guy and ate hotdog recently. He  think  pork bothers home.    Past Medical History:  Diagnosis Date  . Abdominal pain 02/12/2008  . Allergic rhinitis 12/08/2014  . Asthma   . Difficulty hearing 02/09/2015    Past Surgical History:  Procedure Laterality Date  . EAR TUBE REMOVAL    . SHOULDER SURGERY Right 2012    Family History  Problem Relation Age of Onset  . Cancer Mother        breast  . Atrial fibrillation Mother   . Cancer Father        brain, lung     Social History   Socioeconomic History  . Marital status: Married    Spouse name: Not on file  . Number of children: Not on file  . Years of education: Not on file  . Highest education level: Not on file  Occupational History  . Not on file  Tobacco Use  . Smoking status: Former Research scientist (life sciences)  .  Smokeless tobacco: Never Used  Substance and Sexual Activity  . Alcohol use: Not Currently    Alcohol/week: 0.0 standard drinks    Comment: seldom  . Drug use: Yes    Types: Marijuana  . Sexual activity: Yes    Partners: Female  Other Topics Concern  . Not on file  Social History Narrative  . Not on file   Social Determinants of Health   Financial Resource Strain:   . Difficulty of Paying Living Expenses:   Food Insecurity:   . Worried About Charity fundraiser in the Last Year:   . Arboriculturist in the Last Year:   Transportation Needs:   . Film/video editor (Medical):   Marland Kitchen Lack of Transportation (Non-Medical):   Physical Activity:   . Days of Exercise per Week:   . Minutes of Exercise per Session:   Stress:   . Feeling of Stress :   Social Connections:   . Frequency of Communication with Friends and Family:   . Frequency of Social Gatherings with Friends and Family:   .  Attends Religious Services:   . Active Member of Clubs or Organizations:   . Attends Archivist Meetings:   Marland Kitchen Marital Status:   Intimate Partner Violence:   . Fear of Current or Ex-Partner:   . Emotionally Abused:   Marland Kitchen Physically Abused:   . Sexually Abused:     Outpatient Medications Prior to Visit  Medication Sig Dispense Refill  . acetaminophen (TYLENOL) 500 MG tablet Take 500 mg by mouth every 8 (eight) hours as needed.    Marland Kitchen albuterol (PROVENTIL HFA;VENTOLIN HFA) 108 (90 BASE) MCG/ACT inhaler Inhale 2 puffs into the lungs every 4 (four) hours as needed for wheezing or shortness of breath. Reported on 08/26/2015    . Aspirin-Salicylamide-Caffeine (BC HEADACHE POWDER PO) Take by mouth as needed. Reported on 08/26/2015    . esomeprazole (NEXIUM) 40 MG capsule Take 40 mg by mouth daily at 12 noon. Reported on 08/26/2015    . fluticasone (FLONASE) 50 MCG/ACT nasal spray Place 2 sprays into both nostrils daily. 16 g 6  . ibuprofen (ADVIL) 800 MG tablet TAKE 1 TABLET BY MOUTH EVERY 8 HOURS AS NEEDED 90 tablet 0  . levocetirizine (XYZAL) 5 MG tablet Take 1 tablet (5 mg total) by mouth every evening. 90 tablet 1  . metoCLOPramide (REGLAN) 10 MG tablet Take 1 tablet (10 mg total) by mouth every 8 (eight) hours as needed for up to 5 days for nausea. 12 tablet 0  . ondansetron (ZOFRAN) 4 MG tablet Take 4 mg by mouth every 8 (eight) hours as needed for nausea or vomiting. As needed for nausea.     Facility-Administered Medications Prior to Visit  Medication Dose Route Frequency Provider Last Rate Last Admin  . bupivacaine (MARCAINE) 0.5 % injection 10 mL  10 mL Infiltration Once Lance Bosch, MD        Allergies  Allergen Reactions  . Vicodin [Hydrocodone-Acetaminophen] Nausea And Vomiting    ROS Review of Systems  Constitutional: Negative for chills and fever.  HENT: Negative for congestion, sinus pressure and sinus pain.   Eyes: Positive for photophobia. Negative for discharge and  visual disturbance.  Respiratory: Negative for cough, shortness of breath and wheezing.   Cardiovascular: Negative for chest pain, palpitations and leg swelling.  Gastrointestinal: Negative for abdominal pain.  Genitourinary: Negative for difficulty urinating.  Musculoskeletal: Negative for back pain, gait problem, neck pain and neck stiffness.  Skin: Negative for rash.  Neurological: Negative for dizziness, seizures, syncope, speech difficulty, weakness, light-headedness and numbness.       No periods of confusion, restlessness, seizure, visual disturbance, fever/chills stiff neck. No urinary or bowel incontinenece, or weakness or numbness of any part of the body.   Hematological: Negative for adenopathy. Does not bruise/bleed easily.  Psychiatric/Behavioral:       No concerns with depression/anxiety.       Objective:    Physical Exam  Constitutional: He is oriented to person, place, and time. He appears well-developed and well-nourished.  HENT:  Head: Normocephalic.  Tender right posterior scalp- no lump, abrasion, bleeding or any deformity, thick and curly hair.   Eyes: Pupils are equal, round, and reactive to light.  Somewhat dilated bilateral, reacts briskly   Cardiovascular: Normal rate, regular rhythm, normal heart sounds and intact distal pulses.  Pulmonary/Chest: Effort normal and breath sounds normal.  Abdominal: Soft. There is no abdominal tenderness.  Musculoskeletal:        General: Normal range of motion.     Cervical back: Normal range of motion and neck supple.  Neurological: He is alert and oriented to person, place, and time. He displays normal reflexes. No cranial nerve deficit. He exhibits normal muscle tone. Coordination normal.  Oriented to name, place, reason why here, age, DOB, complete address, description of injury and events in the ED, recall 2 out of 3 objects and no neurological defects.    Vitals reviewed.   BP (!) 134/100 (BP Location: Left Arm,  Patient Position: Sitting, Cuff Size: Normal)   Pulse 75   Temp 98.2 F (36.8 C) (Skin)   Ht '5\' 6"'$  (1.676 m)   Wt 181 lb 12.8 oz (82.5 kg)   SpO2 97%   BMI 29.34 kg/m  Wt Readings from Last 3 Encounters:  10/10/19 181 lb 12.8 oz (82.5 kg)  10/09/19 185 lb (83.9 kg)  06/17/19 182 lb (82.6 kg)     Health Maintenance Due  Topic Date Due  . HIV Screening  Never done  . COVID-19 Vaccine (1) Never done    There are no preventive care reminders to display for this patient.  Lab Results  Component Value Date   TSH 1.51 09/06/2018   Lab Results  Component Value Date   WBC 7.4 09/25/2018   HGB 14.3 09/25/2018   HCT 42.2 09/25/2018   MCV 91.5 09/25/2018   PLT 257 09/25/2018   Lab Results  Component Value Date   NA 137 09/25/2018   K 3.6 09/25/2018   CO2 25 09/25/2018   GLUCOSE 94 09/25/2018   BUN 9 09/25/2018   CREATININE 0.85 09/25/2018   BILITOT 0.7 09/25/2018   ALKPHOS 74 09/25/2018   AST 29 09/25/2018   ALT 26 09/25/2018   PROT 7.5 09/25/2018   ALBUMIN 4.4 09/25/2018   CALCIUM 9.3 09/25/2018   ANIONGAP 10 09/25/2018   GFR 91.40 09/06/2018   Lab Results  Component Value Date   CHOL 201 (H) 09/16/2016   Lab Results  Component Value Date   HDL 34.90 (L) 09/16/2016   No results found for: [facility] Lab Results  Component Value Date   TRIG 244.0 (H) 09/16/2016   Lab Results  Component Value Date   CHOLHDL 6 09/16/2016   No results found for: HGBA1C  CLINICAL DATA:  Headache after fall 3 days prior. Dizziness and blurred vision  EXAM: CT HEAD WITHOUT CONTRAST  TECHNIQUE: Contiguous axial images were obtained from the base of  the skull through the vertex without intravenous contrast.  COMPARISON:  Sep 25, 2018  FINDINGS: Brain: Ventricles and sulci are normal in size and configuration. There is no intracranial mass, hemorrhage, extra-axial fluid collection, or midline shift. The brain parenchyma appears unremarkable. No evident acute  infarct.  Vascular: No hyperdense vessel. No appreciable vascular calcification.  Skull: The bony calvarium appears intact.  Sinuses/Orbits: Visualized paranasal sinuses are clear. Visualized orbits appear symmetric bilaterally.  Other: Mastoid air cells are virtually aplastic bilaterally.  IMPRESSION: Mastoid air cells virtually aplastic bilaterally, an apparent anatomic variant. Study otherwise unremarkable. Brain parenchyma appears unremarkable. No mass or hemorrhage.   Electronically Signed   By: Lowella Grip III M.D.   On: 10/09/2019 09:47    Assessment & Plan:   Problem List Items Addressed This Visit      Cardiovascular and Mediastinum   Essential hypertension   Relevant Medications   amLODipine (NORVASC) 2.5 MG tablet     Nervous and Auditory   Concussion with loss of consciousness - Primary   Relevant Orders   Ambulatory referral to Sports Medicine      Meds ordered this encounter  Medications  . amLODipine (NORVASC) 2.5 MG tablet    Sig: Take 1 tablet (2.5 mg total) by mouth daily.    Dispense:  90 tablet    Refill:  0    Order Specific Question:   Supervising Provider    Answer:   Einar Pheasant [[id number]]   Patient advised:  1. For High BP: Begin low dose amlodipine 2.5 mg one pill daily.  Office visit in 1 month to check the results. If you have a BP arm cuff at home, bring in home readings.   2. For Concussion: You have had  a diagnosis of concussion. A concussion is a brain injury that results from a hard, direct hit (trauma) to your head or body. Please take it easy. Tylenol per package directions is OK to take. Do not take more  Tylenol than recommended. Work note provided for today and tomorrow.   I have placed a referral to Sports Medicine Dr. Gardenia Phlegm who runs a concussion clinic for follow-up since you are having headaches.   Return to the our clinic next week if not improving.  Return to the ER for alarm signs reviewed and  given on AVS.   Follow-up: Return in about 4 weeks (around 11/07/2019).   This visit occurred during the SARS-CoV-2 public health emergency.  Safety protocols were in place, including screening questions prior to the visit, additional usage of staff PPE, and extensive cleaning of exam room while observing appropriate contact time as indicated for disinfecting solutions.   Denice Paradise, NP

## 2019-10-11 NOTE — Telephone Encounter (Signed)
Called pt and he feels like his symptoms are improving, especially after being seen by his PCP and having some changes made to medications.  He wants to hold off on scheduling a concussion visit at this time.  I advise that I will call him back on Monday to check-in and see how he's doing and he can make a decision at that time as to whether or note he wants to be seen.  Pt verbalizes understanding and agreement.

## 2019-10-18 ENCOUNTER — Telehealth: Payer: Self-pay | Admitting: Internal Medicine

## 2019-10-18 NOTE — Telephone Encounter (Signed)
Patient spoke to [facility] in regards to his concussion. He feels that he has improved and does not wish to schedule at this time.    Sports medicine

## 2019-11-07 ENCOUNTER — Telehealth: Payer: PRIVATE HEALTH INSURANCE | Admitting: Internal Medicine

## 2019-11-11 ENCOUNTER — Telehealth: Payer: Self-pay | Admitting: Internal Medicine

## 2019-11-11 NOTE — Telephone Encounter (Signed)
Spoke with pt and he stated that he went to the ED on Tuesday last week for pain in lower abdomen, N&V. Pt was diagnosed with a kidney stone and sent home. Pt stated that on Thursday he went back because he was in even more pain and urinating blood. He stated that the doctor there told him that if he didn't pass the stone in the next couple of days that he may have to see a surgeon but didn't tell the pt who or where. Pt stated that he still has not passed the stone but that he isn't hurting as bad as a week ago, pain level is a 5 to 6.

## 2019-11-11 NOTE — Telephone Encounter (Signed)
Pt called needs a call back about kidney stone and surgery

## 2019-11-11 NOTE — Telephone Encounter (Signed)
Spoke with pt and informed him of Dr. Melina Schools message below. He stated that he will wait and see if he passes the stone and give [place] a call in the next week to let us know if he has or not.

## 2019-11-11 NOTE — Telephone Encounter (Signed)
[facility] Records reviewed.  He has a 3 mm non obstructing stone at the junction of where the ureter meers the bladder.  He should be able to pass this on his own (typically we don't refer unless the stones are 6 mm of larger , or if the kidney looks dilated from a blocked ureter, neither of which he has).  But it looks like they DID MAKE a referral to a [facility] urologist which I have printed (he should be able to see it on his mychart [facility] account)

## 2019-12-30 IMAGING — CT CT HEAD WITHOUT CONTRAST
4 series · 17 of 47 positions shown, 19 images · non-contrast
Comparison: None.

CLINICAL DATA: Seizure.

EXAM:
CT HEAD WITHOUT CONTRAST
TECHNIQUE: Contiguous axial images were obtained from the base of the skull
through the vertex without intravenous contrast.

[Series 2: head wo · axial · 0.41mm/px · z∈[+755,+865]mm · 7 of 30 slices shown, 9 images]
[im 4/30  brain]
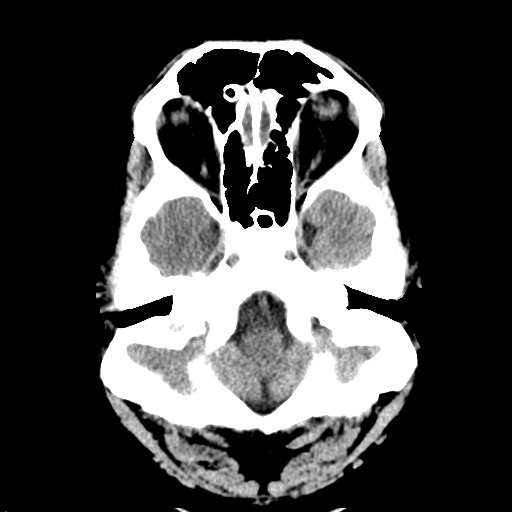
[im 4/30  bone]
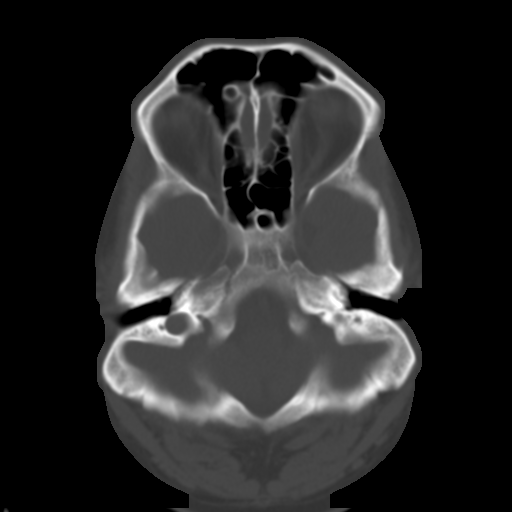
[im 8/30  brain]
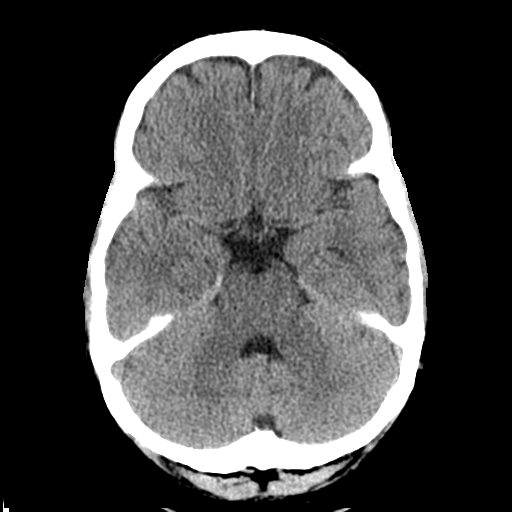
[im 11/30  brain]
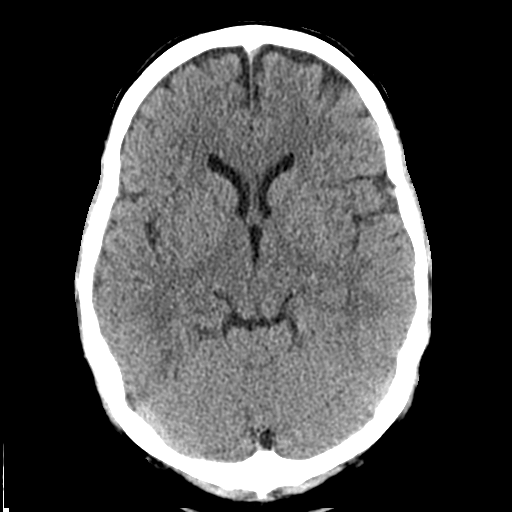
[im 15/30  brain]
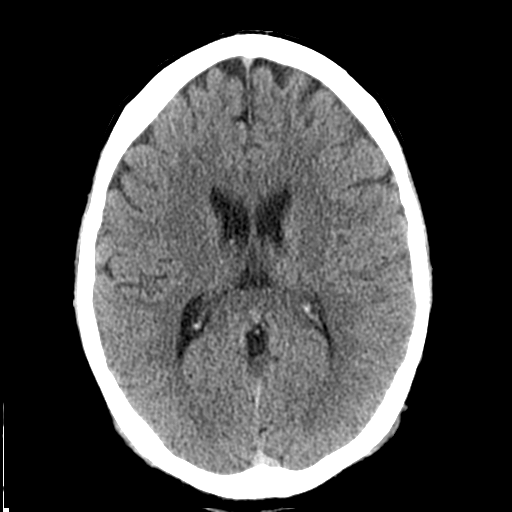
[im 19/30  brain]
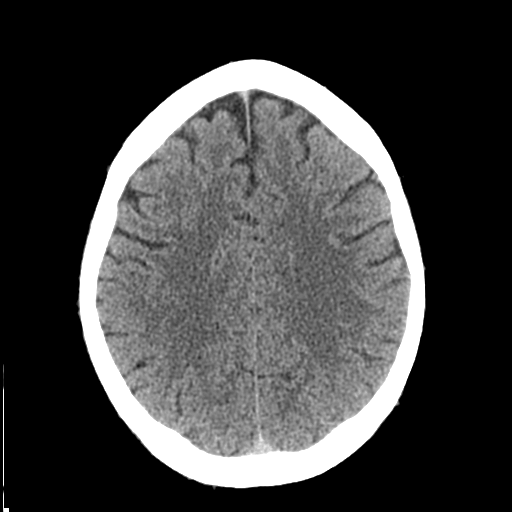
[im 19/30  bone]
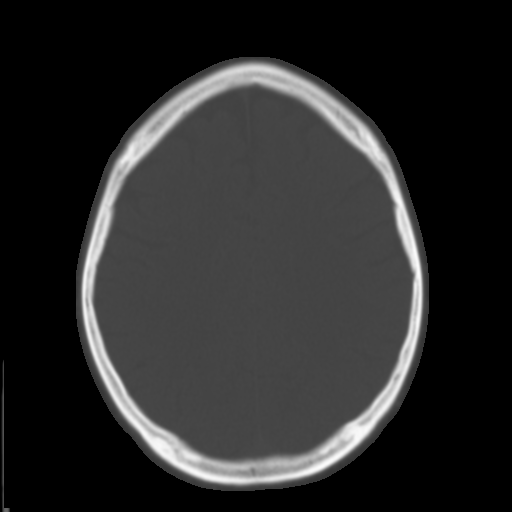
[im 22/30  brain]
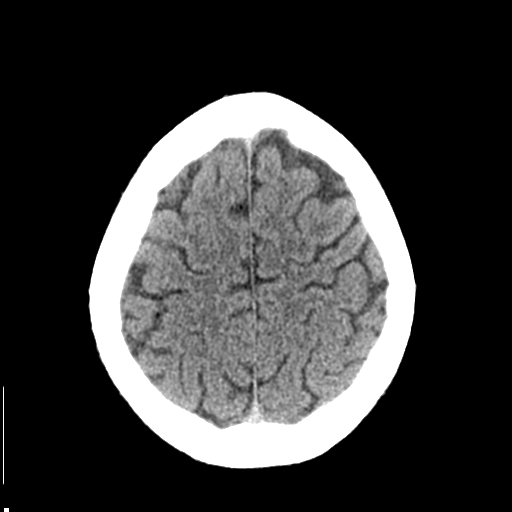
[im 26/30  brain]
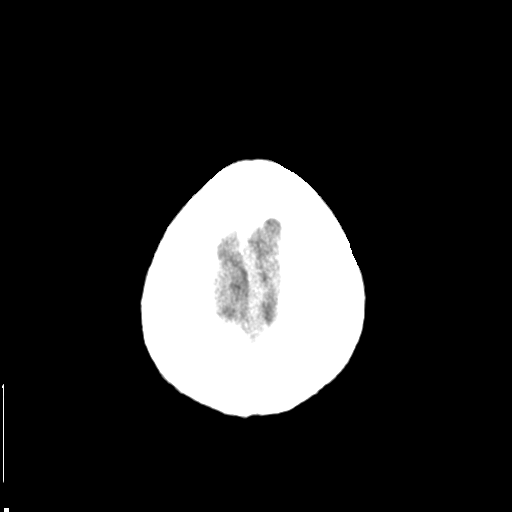

[Series 3: head bone · axial · 0.41mm/px · z∈[+754,+804]mm · 4 of 73 slices shown]
[im 8/73  bone]
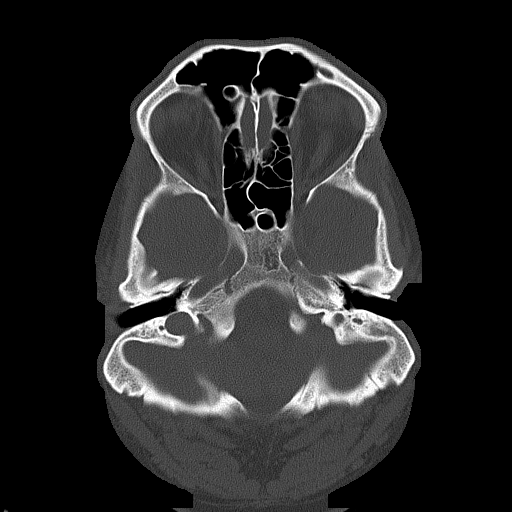
[im 15/73  bone]
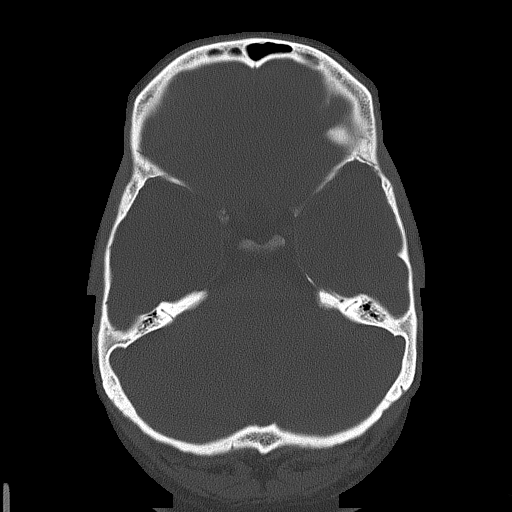
[im 22/73  bone]
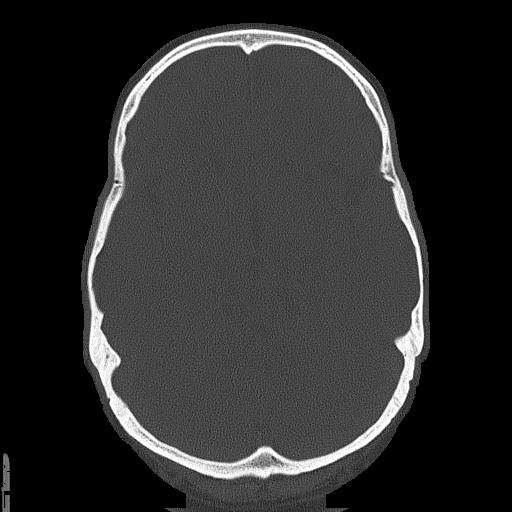
[im 33/73  bone]
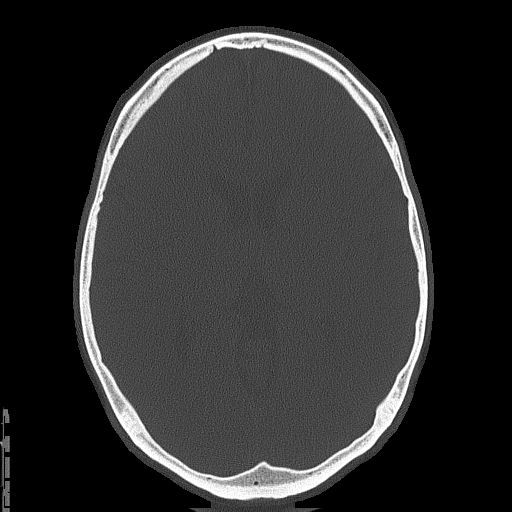

[Series 4: coronal soft tissue · coronal · 0.32mm/px · 3 of 69 slices shown]
[im 23/69  brain]
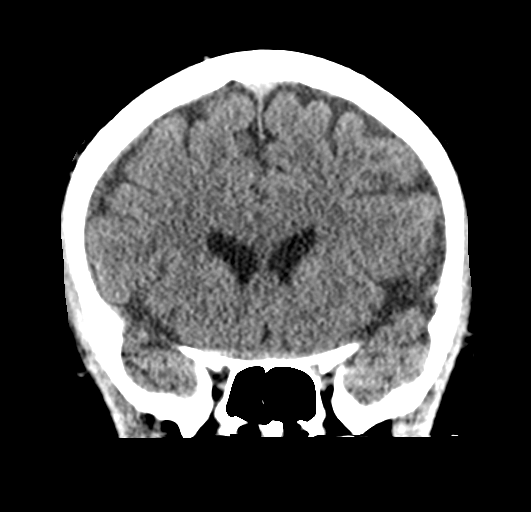
[im 31/69  brain]
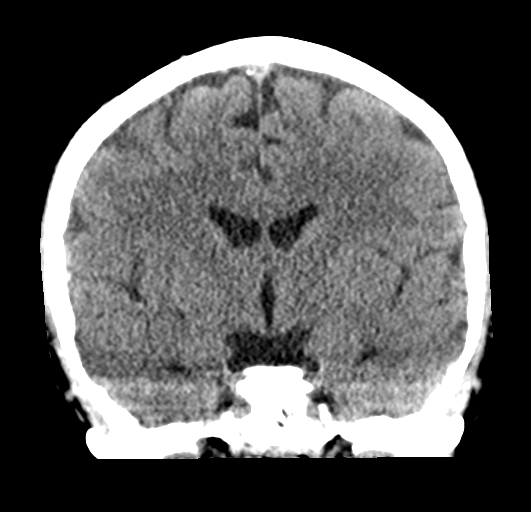
[im 38/69  brain]
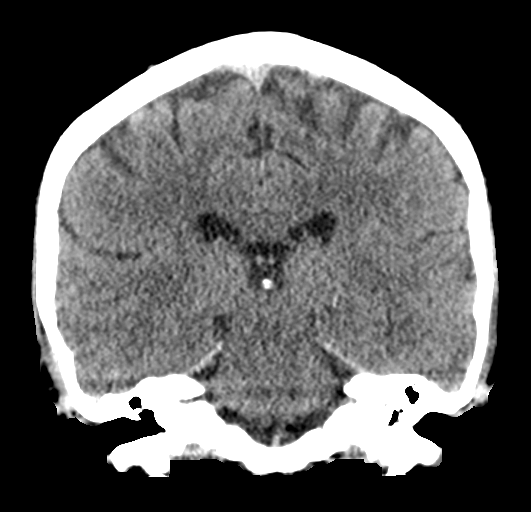

[Series 5: sagittal soft tissue · sagittal · 0.30mm/px · 3 of 55 slices shown]
[im 19/55  brain]
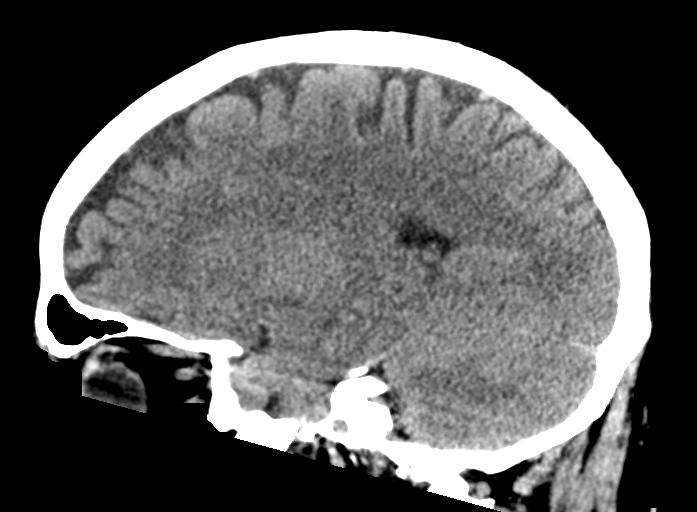
[im 28/55  brain]
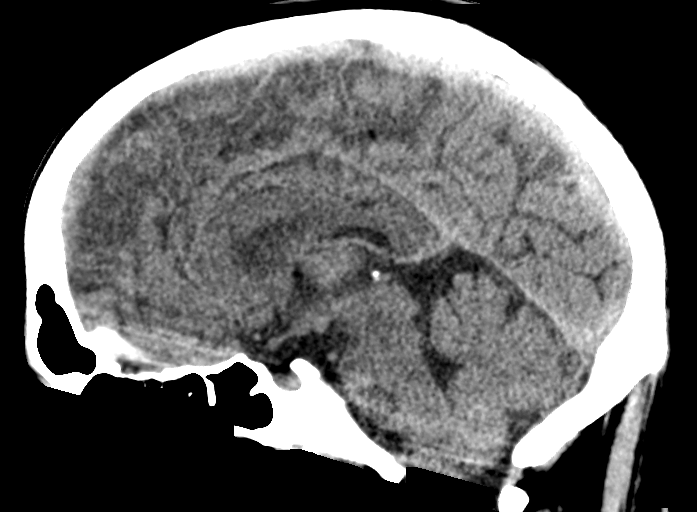
[im 37/55  brain]
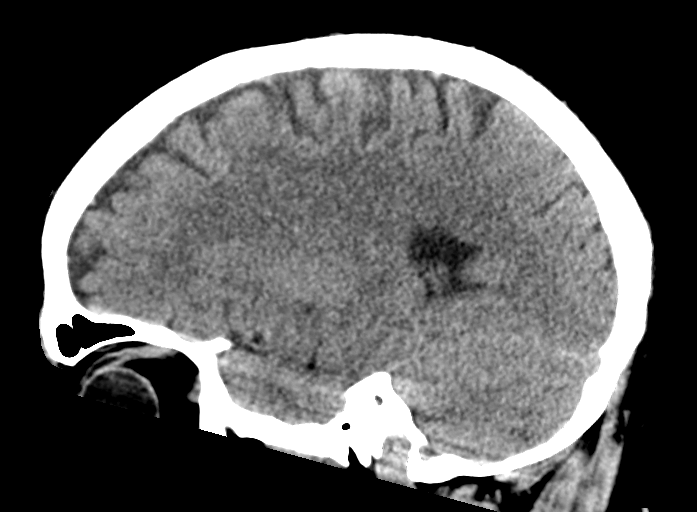

[17 of 47 positions shown; findings below may reference images not displayed]

FINDINGS: Brain: No evidence of acute infarction, hemorrhage, hydrocephalus,
extra-axial collection or mass lesion/mass effect.

Vascular: No hyperdense vessel or unexpected calcification.

Skull: Normal. Negative for fracture or focal lesion.

Sinuses/Orbits: No acute finding.

Other: None.
IMPRESSION: Normal head CT.

## 2020-01-12 ENCOUNTER — Other Ambulatory Visit: Payer: Self-pay | Admitting: Nurse Practitioner

## 2020-05-19 ENCOUNTER — Telehealth: Payer: Self-pay | Admitting: Internal Medicine

## 2020-05-19 MED ORDER — AMLODIPINE BESYLATE 2.5 MG PO TABS: 2.5000 mg | ORAL_TABLET | Freq: Every day | ORAL | 2 refills | Status: DC

## 2020-05-19 NOTE — Telephone Encounter (Signed)
Patient is requesting a refill on his BP medication, amLODipine (NORVASC) 2.5 MG tablet.

## 2020-05-27 ENCOUNTER — Other Ambulatory Visit: Payer: Self-pay

## 2020-05-29 ENCOUNTER — Ambulatory Visit (INDEPENDENT_AMBULATORY_CARE_PROVIDER_SITE_OTHER): Payer: BC Managed Care – PPO | Admitting: Internal Medicine

## 2020-05-29 ENCOUNTER — Other Ambulatory Visit: Payer: Self-pay

## 2020-05-29 ENCOUNTER — Encounter: Payer: Self-pay | Admitting: Internal Medicine

## 2020-05-29 VITALS — BP 126/84 | HR 92 | Temp 98.0°F | Ht 66.0 in | Wt 178.0 lb

## 2020-05-29 DIAGNOSIS — I10 Essential (primary) hypertension: Secondary | ICD-10-CM

## 2020-05-29 DIAGNOSIS — L74513 Primary focal hyperhidrosis, soles: Secondary | ICD-10-CM | POA: Diagnosis not present

## 2020-05-29 DIAGNOSIS — R41840 Attention and concentration deficit: Secondary | ICD-10-CM

## 2020-05-29 DIAGNOSIS — Z87442 Personal history of urinary calculi: Secondary | ICD-10-CM

## 2020-05-29 DIAGNOSIS — L74519 Primary focal hyperhidrosis, unspecified: Secondary | ICD-10-CM | POA: Diagnosis not present

## 2020-05-29 LAB — COMPREHENSIVE METABOLIC PANEL: Albumin: 4.6 g/dL (ref 3.6–5.1)

## 2020-05-29 LAB — CBC WITH DIFFERENTIAL/PLATELET: MCH: 31.5 pg (ref 27.0–33.0)
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
## 2020-05-29 NOTE — Patient Instructions (Addendum)
You need 60 ounces of water daily   You need citric  acid daily in your drinks  (freshl emon, lime or orange juice)   CHANGE SOCKS OFTEN.  USE ONLY COTTON SOCKS  GOLD BOND MEDICATED POWDER WITH ZINC WITH EACH CHANGE  MEDICATION TBD   REFERRAL  FOR ADD TESTING IN PROCESS

## 2020-05-30 LAB — COMPREHENSIVE METABOLIC PANEL
AG Ratio: 1.6 (calc) (ref 1.0–2.5)
ALT: 23 U/L (ref 9–46)
AST: 16 U/L (ref 10–40)
Alkaline phosphatase (APISO): 60 U/L (ref 36–130)
BUN: 7 mg/dL (ref 7–25)
CO2: 24 mmol/L (ref 20–32)
Calcium: 9.4 mg/dL (ref 8.6–10.3)
Chloride: 104 mmol/L (ref 98–110)
Creat: 0.7 mg/dL (ref 0.60–1.35)
Globulin: 2.9 g/dL (calc) (ref 1.9–3.7)
Glucose, Bld: 74 mg/dL (ref 65–99)
Potassium: 3.9 mmol/L (ref 3.5–5.3)
Sodium: 137 mmol/L (ref 135–146)
Total Bilirubin: 0.4 mg/dL (ref 0.2–1.2)
Total Protein: 7.5 g/dL (ref 6.1–8.1)

## 2020-05-30 LAB — CBC WITH DIFFERENTIAL/PLATELET
Absolute Monocytes: 626 cells/uL (ref 200–950)
Basophils Absolute: 32 cells/uL (ref 0–200)
Basophils Relative: 0.7 %
Eosinophils Absolute: 18 cells/uL (ref 15–500)
Eosinophils Relative: 0.4 %
HCT: 44.9 % (ref 38.5–50.0)
Hemoglobin: 15.8 g/dL (ref 13.2–17.1)
Lymphs Abs: 1179 cells/uL (ref 850–3900)
MCHC: 35.2 g/dL (ref 32.0–36.0)
MCV: 89.6 fL (ref 80.0–100.0)
MPV: 11.3 fL (ref 7.5–12.5)
Monocytes Relative: 13.9 %
Neutro Abs: 2646 cells/uL (ref 1500–7800)
Neutrophils Relative %: 58.8 %
Platelets: 245 10*3/uL (ref 140–400)
RBC: 5.01 10*6/uL (ref 4.20–5.80)
RDW: 12.9 % (ref 11.0–15.0)
Total Lymphocyte: 26.2 %
WBC: 4.5 10*3/uL (ref 3.8–10.8)

## 2020-05-30 LAB — TSH: TSH: 1.83 mIU/L (ref 0.40–4.50)

## 2020-05-30 NOTE — Progress Notes (Signed)
Your CBC, thyroid   liver and kidney function are normal.     Please Plan to repeat fasting labs in 6 months  Regards,   Duncan Dull, MD

## 2020-05-31 ENCOUNTER — Encounter: Payer: Self-pay | Admitting: Internal Medicine

## 2020-05-31 DIAGNOSIS — R41840 Attention and concentration deficit: Secondary | ICD-10-CM | POA: Insufficient documentation

## 2020-05-31 DIAGNOSIS — Z87442 Personal history of urinary calculi: Secondary | ICD-10-CM | POA: Insufficient documentation

## 2020-05-31 DIAGNOSIS — L74513 Primary focal hyperhidrosis, soles: Secondary | ICD-10-CM | POA: Insufficient documentation

## 2020-05-31 HISTORY — DX: Attention and concentration deficit: R41.840

## 2020-05-31 MED ORDER — ALUMINUM CHLORIDE 20 % EX SOLN: Freq: Every day | CUTANEOUS | 5 refills | Status: DC

## 2020-05-31 NOTE — Assessment & Plan Note (Addendum)
Resulting in maceration of skin between toes and obnoxius odor. Advised to change socks several time daily,  Use Gold bond medicated powder with zinc with each sock change, and trial of  20%  aluminum chloride hexahydrate  (Drysol antiperspirant) to feet at night daily until excessive sweating resolves.

## 2020-05-31 NOTE — Assessment & Plan Note (Signed)
Well controlled on amlodipine 2.5 mg daily . Renal function stable, no changes today.

## 2020-05-31 NOTE — Assessment & Plan Note (Signed)
Symptoms appear to be limited to reading and answering of questionnaires .  Referral for ADD testing.  wellbutrin is relatively C/I due to history of seizure during period of prescription drug abuse

## 2020-05-31 NOTE — Assessment & Plan Note (Addendum)
Right sided  3 mm, nonobstructing,  With 2 ER visits for hematuria and flank pain, June and July 2021   He received #`0 hydrocodone during June ER visit Advised to add citric acid to diet.

## 2020-06-04 DIAGNOSIS — Z20822 Contact with and (suspected) exposure to covid-19: Secondary | ICD-10-CM | POA: Diagnosis not present

## 2020-06-04 DIAGNOSIS — Z03818 Encounter for observation for suspected exposure to other biological agents ruled out: Secondary | ICD-10-CM | POA: Diagnosis not present

## 2020-06-09 DIAGNOSIS — Z03818 Encounter for observation for suspected exposure to other biological agents ruled out: Secondary | ICD-10-CM | POA: Diagnosis not present

## 2020-06-09 DIAGNOSIS — Z20822 Contact with and (suspected) exposure to covid-19: Secondary | ICD-10-CM | POA: Diagnosis not present

## 2020-08-17 ENCOUNTER — Ambulatory Visit
Admission: EM | Admit: 2020-08-17 | Discharge: 2020-08-17 | Disposition: A | Discharge status: Home / Self Care | Payer: BC Managed Care – PPO | Attending: Physician Assistant | Admitting: Physician Assistant

## 2020-08-17 ENCOUNTER — Encounter: Payer: Self-pay | Admitting: Emergency Medicine

## 2020-08-17 ENCOUNTER — Other Ambulatory Visit: Payer: Self-pay

## 2020-08-17 DIAGNOSIS — R112 Nausea with vomiting, unspecified: Secondary | ICD-10-CM | POA: Diagnosis not present

## 2020-08-17 DIAGNOSIS — J019 Acute sinusitis, unspecified: Secondary | ICD-10-CM | POA: Diagnosis not present

## 2020-08-17 DIAGNOSIS — R5383 Other fatigue: Secondary | ICD-10-CM | POA: Diagnosis not present

## 2020-08-17 DIAGNOSIS — J45909 Unspecified asthma, uncomplicated: Secondary | ICD-10-CM | POA: Diagnosis not present

## 2020-08-17 DIAGNOSIS — Z20822 Contact with and (suspected) exposure to covid-19: Secondary | ICD-10-CM | POA: Diagnosis not present

## 2020-08-17 DIAGNOSIS — R059 Cough, unspecified: Secondary | ICD-10-CM | POA: Insufficient documentation

## 2020-08-17 DIAGNOSIS — H66001 Acute suppurative otitis media without spontaneous rupture of ear drum, right ear: Secondary | ICD-10-CM | POA: Insufficient documentation

## 2020-08-17 DIAGNOSIS — Z87891 Personal history of nicotine dependence: Secondary | ICD-10-CM | POA: Diagnosis not present

## 2020-08-17 DIAGNOSIS — R0981 Nasal congestion: Secondary | ICD-10-CM | POA: Diagnosis not present

## 2020-08-17 DIAGNOSIS — R52 Pain, unspecified: Secondary | ICD-10-CM | POA: Insufficient documentation

## 2020-08-17 LAB — RESP PANEL BY RT-PCR (FLU A&B, COVID) ARPGX2
Influenza A by PCR: NEGATIVE
Influenza B by PCR: NEGATIVE
SARS Coronavirus 2 by RT PCR: NEGATIVE

## 2020-08-17 LAB — GROUP A STREP BY PCR: Group A Strep by PCR: NOT DETECTED

## 2020-08-17 MED ORDER — ONDANSETRON 4 MG PO TBDP: 4.0000 mg | ORAL_TABLET | Freq: Three times a day (TID) | ORAL | 0 refills | Status: AC | PRN

## 2020-08-17 MED ORDER — AMOXICILLIN-POT CLAVULANATE 875-125 MG PO TABS: 1.0000 | ORAL_TABLET | Freq: Two times a day (BID) | ORAL | 0 refills | Status: AC
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
## 2020-08-17 NOTE — ED Triage Notes (Signed)
Patient c/o nasal congestion that started 2 weeks ago. He reports yesterday he woke up and started having pressure in his ears, vomiting, nausea and body aches.

## 2020-08-17 NOTE — Discharge Instructions (Signed)
Your flu, Covid, and strep test are all negative.  Your symptoms and exam are consistent with a sinus infection and ear infection.  I have sent an antibiotic for you to take.  Make sure you complete the full course.  Increase rest and fluids.  Continue using over-the-counter decongestants.  Have also sent a nausea medication for you to take as needed.  Follow-up if you have any fever or worsening symptoms.

## 2020-08-20 ENCOUNTER — Other Ambulatory Visit: Payer: Self-pay | Admitting: Internal Medicine

## 2020-11-28 ENCOUNTER — Other Ambulatory Visit: Payer: Self-pay | Admitting: Internal Medicine

## 2021-03-03 ENCOUNTER — Other Ambulatory Visit: Payer: Self-pay | Admitting: Internal Medicine

## 2021-03-17 DIAGNOSIS — J301 Allergic rhinitis due to pollen: Secondary | ICD-10-CM | POA: Diagnosis not present

## 2021-03-17 DIAGNOSIS — H6983 Other specified disorders of Eustachian tube, bilateral: Secondary | ICD-10-CM | POA: Diagnosis not present

## 2021-05-23 ENCOUNTER — Other Ambulatory Visit: Payer: Self-pay | Admitting: Internal Medicine

## 2021-05-23 DIAGNOSIS — I10 Essential (primary) hypertension: Secondary | ICD-10-CM

## 2021-05-25 NOTE — Telephone Encounter (Signed)
Attempted to call pt. No answer no voicemail. Need to schedule pt an appt.

## 2021-05-28 NOTE — Telephone Encounter (Signed)
I called and spoke with the patient and scheduled him for an follow up appointment  this month and I sent in his amlodipine 30 tablets with 0 refills.  Yasemin Rabon,cma

## 2021-06-18 ENCOUNTER — Ambulatory Visit: Payer: BC Managed Care – PPO | Admitting: Internal Medicine

## 2021-06-28 ENCOUNTER — Ambulatory Visit: Payer: BC Managed Care – PPO | Admitting: Internal Medicine

## 2021-06-28 ENCOUNTER — Other Ambulatory Visit: Payer: Self-pay | Admitting: Internal Medicine

## 2021-06-28 DIAGNOSIS — I10 Essential (primary) hypertension: Secondary | ICD-10-CM

## 2021-07-01 ENCOUNTER — Other Ambulatory Visit: Payer: Self-pay

## 2021-07-01 ENCOUNTER — Ambulatory Visit (INDEPENDENT_AMBULATORY_CARE_PROVIDER_SITE_OTHER): Payer: BLUE CROSS/BLUE SHIELD | Admitting: Internal Medicine

## 2021-07-01 VITALS — BP 140/74 | HR 82 | Temp 98.1°F | Ht 66.0 in | Wt 177.4 lb

## 2021-07-01 DIAGNOSIS — Z113 Encounter for screening for infections with a predominantly sexual mode of transmission: Secondary | ICD-10-CM

## 2021-07-01 DIAGNOSIS — L723 Sebaceous cyst: Secondary | ICD-10-CM

## 2021-07-01 DIAGNOSIS — E782 Mixed hyperlipidemia: Secondary | ICD-10-CM

## 2021-07-01 DIAGNOSIS — I10 Essential (primary) hypertension: Secondary | ICD-10-CM

## 2021-07-01 DIAGNOSIS — R0989 Other specified symptoms and signs involving the circulatory and respiratory systems: Secondary | ICD-10-CM

## 2021-07-01 DIAGNOSIS — N200 Calculus of kidney: Secondary | ICD-10-CM | POA: Insufficient documentation

## 2021-07-01 DIAGNOSIS — F411 Generalized anxiety disorder: Secondary | ICD-10-CM

## 2021-07-01 DIAGNOSIS — F1911 Other psychoactive substance abuse, in remission: Secondary | ICD-10-CM

## 2021-07-01 LAB — URINALYSIS, ROUTINE W REFLEX MICROSCOPIC
Bilirubin Urine: NEGATIVE
Hgb urine dipstick: NEGATIVE
Ketones, ur: NEGATIVE
Leukocytes,Ua: NEGATIVE
Nitrite: NEGATIVE
RBC / HPF: NONE SEEN (ref 0–?)
Specific Gravity, Urine: 1.01 (ref 1.000–1.030)
Total Protein, Urine: NEGATIVE
Urine Glucose: NEGATIVE
Urobilinogen, UA: 0.2 (ref 0.0–1.0)
pH: 6.5 (ref 5.0–8.0)

## 2021-07-01 LAB — COMPREHENSIVE METABOLIC PANEL
ALT: 51 U/L (ref 0–53)
AST: 22 U/L (ref 0–37)
Albumin: 4.3 g/dL (ref 3.5–5.2)
Alkaline Phosphatase: 66 U/L (ref 39–117)
BUN: 6 mg/dL (ref 6–23)
CO2: 29 mEq/L (ref 19–32)
Calcium: 9.7 mg/dL (ref 8.4–10.5)
Chloride: 104 mEq/L (ref 96–112)
Creatinine, Ser: 0.86 mg/dL (ref 0.40–1.50)
GFR: 116.35 mL/min (ref >60.00)
Glucose, Bld: 77 mg/dL (ref 70–99)
Potassium: 4 mEq/L (ref 3.5–5.1)
Sodium: 140 mEq/L (ref 135–145)
Total Bilirubin: 0.5 mg/dL (ref 0.2–1.2)
Total Protein: 7.4 g/dL (ref 6.0–8.3)

## 2021-07-01 LAB — LIPID PANEL
Cholesterol: 222 mg/dL — ABNORMAL HIGH (ref 0–200)
HDL: 44.9 mg/dL (ref >39.00)
NonHDL: 177.31
Total CHOL/HDL Ratio: 5
Triglycerides: 247 mg/dL — ABNORMAL HIGH (ref 0.0–149.0)
VLDL: 49.4 mg/dL — ABNORMAL HIGH (ref 0.0–40.0)

## 2021-07-01 LAB — LDL CHOLESTEROL, DIRECT: Direct LDL: 167 mg/dL

## 2021-07-01 MED ORDER — SULFAMETHOXAZOLE-TRIMETHOPRIM 800-160 MG PO TABS: 1.0000 | ORAL_TABLET | Freq: Two times a day (BID) | ORAL | 0 refills | Status: DC

## 2021-07-01 MED ORDER — AMLODIPINE BESYLATE 5 MG PO TABS: 5.0000 mg | ORAL_TABLET | Freq: Every day | ORAL | 1 refills | Status: DC

## 2021-07-01 NOTE — Patient Instructions (Addendum)
Good to see you!   1) For your kidney stones:  Add lemon juice (fresh)  to every glass of water  (anything with citric acid) YOU CAN ALSO DRINK CRANBERRY JUICE  Drink one shot of the apple cider vinegar once or twice daily  Drink at least 60 ounces of water daily .    Urinalysis and culture today to rule out UTI    For the "knot" on your scalp: it's a sebaceous cyst.   Leave it alone!  Touching it increases your risk of getting the cyst infected with bacteria from your hands  If it does,  it will hurt and you can take the antibiotic (sulfa....)  for one week   3) Increase the amlodipine dose to 5 mg daily for your blood pressure Watch your "sodium intake."  This is often added to "hamburger helper" and other mixes to increase flavor . It will raise your blood pressure .  Drinking more water helps flush it out

## 2021-07-01 NOTE — Assessment & Plan Note (Addendum)
Recurrent..Last ER visit July 2022 ,  [facility] 3 mm ureteral stone.  most recent episode was several week ago.  Reviewed liquid intake.  Encouraged by urology to use apple cider vinegar, which he has not been compliant.  ua and culture ordered

## 2021-07-01 NOTE — Progress Notes (Signed)
Subjective:  Patient ID: Kyle Marshall, male    DOB: 20-Nov-1990  Age: 31 y.o. MRN: [id number]  CC: The primary encounter diagnosis was Right carotid bruit. Diagnoses of Essential hypertension, Nephrolithiasis, Screen for STD (sexually transmitted disease), Sebaceous cyst, Generalized anxiety disorder, and Substance abuse in remission [facility]) were also pertinent to this visit.   This visit occurred during the SARS-CoV-2 public health emergency.  Safety protocols were in place, including screening questions prior to the visit, additional usage of staff PPE, and extensive cleaning of exam room while observing appropriate contact time as indicated for disinfecting solutions.    HPI Kyle Marshall presents for follow up.  Last seen jan 2022  Chief Complaint  Patient presents with   Follow-up    Follow up for HTN. Pt also complaining of "knot" in back of head. Pt reports "annoying" pain periodically.     He feels generally well,  has been promoted to [employer],  family doing well.     1) HTN :  taking amlodipine 2.5 mg daily  elevations suspected at times due to mild headaches,  but does not check at home   2) has a persistent scalp knot on occipital area , mildly tender    3 ) Recurrent nephrolithiasis        Outpatient Medications Prior to Visit  Medication Sig Dispense Refill   albuterol (PROVENTIL HFA;VENTOLIN HFA) 108 (90 BASE) MCG/ACT inhaler Inhale 2 puffs into the lungs every 4 (four) hours as needed for wheezing or shortness of breath. Reported on 08/26/2015     aluminum chloride (DRYSOL) 20 % external solution Apply topically at bedtime. To feet 35 mL 5   esomeprazole (NEXIUM) 40 MG capsule Take 40 mg by mouth daily at 12 noon. Reported on 08/26/2015     fluticasone (FLONASE) 50 MCG/ACT nasal spray Place 2 sprays into both nostrils daily. 16 g 6   ibuprofen (ADVIL) 800 MG tablet TAKE 1 TABLET BY MOUTH EVERY 8 HOURS AS NEEDED 90 tablet 0   levocetirizine (XYZAL) 5 MG tablet  Take 1 tablet (5 mg total) by mouth every evening. 90 tablet 1   amLODipine (NORVASC) 2.5 MG tablet TAKE 1 TABLET BY MOUTH EVERY DAY *MUST FILL WITH OPTUM* 30 tablet 0   metoCLOPramide (REGLAN) 10 MG tablet Take 1 tablet (10 mg total) by mouth every 8 (eight) hours as needed for up to 5 days for nausea. 12 tablet 0   ondansetron (ZOFRAN) 4 MG tablet Take 4 mg by mouth every 8 (eight) hours as needed for nausea or vomiting. As needed for nausea. (Patient not taking: Reported on 07/01/2021)     Facility-Administered Medications Prior to Visit  Medication Dose Route Frequency Provider Last Rate Last Admin   bupivacaine (MARCAINE) 0.5 % injection 10 mL  10 mL Infiltration Once Tod Persia, MD        Review of Systems;  Patient denies headache, fevers, malaise, unintentional weight loss, skin rash, eye pain, sinus congestion and sinus pain, sore throat, dysphagia,  hemoptysis , cough, dyspnea, wheezing, chest pain, palpitations, orthopnea, edema, abdominal pain, nausea, melena, diarrhea, constipation, flank pain, dysuria, hematuria, urinary  Frequency, nocturia, numbness, tingling, seizures,  Focal weakness, Loss of consciousness,  Tremor, insomnia, depression, anxiety, and suicidal ideation.      Objective:  BP 140/74 (BP Location: Right Arm, Patient Position: Sitting, Cuff Size: Small)    Pulse 82    Temp 98.1 F (36.7 C) (Oral)    Ht 5\' 6"  (  1.676 m)    Wt 177 lb 6.4 oz (80.5 kg)    SpO2 99%    BMI 28.63 kg/m   BP Readings from Last 3 Encounters:  07/01/21 140/74  08/17/20 117/90  05/29/20 126/84    Wt Readings from Last 3 Encounters:  07/01/21 177 lb 6.4 oz (80.5 kg)  08/17/20 175 lb (79.4 kg)  05/29/20 178 lb (80.7 kg)    General appearance: alert, cooperative and appears stated age Ears: normal TM's and external ear canals both ears Throat: lips, mucosa, and tongue normal; teeth and gums normal Neck: no adenopathy, no carotid bruit, supple, symmetrical, trachea midline and  thyroid not enlarged, symmetric, no tenderness/mass/nodules Back: symmetric, no curvature. ROM normal. No CVA tenderness. Lungs: clear to auscultation bilaterally Heart: regular rate and rhythm, S1, S2 normal, no murmur, click, rub or gallop Abdomen: soft, non-tender; bowel sounds normal; no masses,  no organomegaly Pulses: 2+ and symmetric Skin: Skin color, texture, turgor normal. No rashes or lesions Lymph nodes: Cervical, supraclavicular, and axillary nodes normal.  No results found for: HGBA1C  Lab Results  Component Value Date   CREATININE 0.86 07/01/2021   CREATININE 0.70 05/29/2020   CREATININE 0.85 09/25/2018    Lab Results  Component Value Date   WBC 4.5 05/29/2020   HGB 15.8 05/29/2020   HCT 44.9 05/29/2020   PLT 245 05/29/2020   GLUCOSE 77 07/01/2021   CHOL 222 (H) 07/01/2021   TRIG 247.0 (H) 07/01/2021   HDL 44.90 07/01/2021   LDLDIRECT 167.0 07/01/2021   ALT 51 07/01/2021   AST 22 07/01/2021   NA 140 07/01/2021   K 4.0 07/01/2021   CL 104 07/01/2021   CREATININE 0.86 07/01/2021   BUN 6 07/01/2021   CO2 29 07/01/2021   TSH 1.83 05/29/2020   MICROALBUR 1.2 07/01/2021    No results found.  Assessment & Plan:   Problem List Items Addressed This Visit     Essential hypertension    BP is not at  goal.  Advised to watch his sodium intake given reports of elevation after eating Hamburger helper.  Increase amlodipine to 5 mg daily       Relevant Medications   amLODipine (NORVASC) 5 MG tablet   Other Relevant Orders   Lipid panel (Completed)   Comprehensive metabolic panel (Completed)   Urine Microalbumin w/creat. ratio (Completed)   Generalized anxiety disorder    Resolved.  No longer taking cymbalta.       Recurrent nephrolithiasis    Recurrent.. most recent episode was several week ago.  Reviewed liquid intake.  Encouraged by urology to use apple cider vinegar, which he has not been compliant.  ua and culture ordered       Sebaceous cyst    At  base of scalp left side . Not currently enflamed.  Septra rx given  for prn use with instructions to call for referral to gen surg for I & D      Substance abuse in remission ([facility])    .  He has been abstinent       Other Visit Diagnoses     Right carotid bruit    -  Primary   Relevant Orders   US Carotid Duplex Bilateral   Screen for STD (sexually transmitted disease)       Relevant Orders   HIV Antibody (routine testing w rflx) (Completed)       I spent 30 minutes dedicated to the care of this patient on the  date of this encounter to include pre-visit review of patient's medical history,  most recent imaging studies, Face-to-face time with the patient , and post visit ordering of testing and therapeutics.    Follow-up: No follow-ups on file.   Crecencio Mc, MD

## 2021-07-01 NOTE — Assessment & Plan Note (Addendum)
At base of scalp left side . Not currently enflamed.  Septra rx given  for prn use with instructions to call for referral to gen surg for I & D

## 2021-07-01 NOTE — Assessment & Plan Note (Addendum)
BP is not at  goal.  Advised to watch his sodium intake given reports of elevation after eating [employer]urger helper.  Increase amlodipine to 5 mg daily

## 2021-07-02 LAB — MICROALBUMIN / CREATININE URINE RATIO
Creatinine,U: 96.1 mg/dL
Microalb Creat Ratio: 1.3 mg/g (ref 0.0–30.0)
Microalb, Ur: 1.2 mg/dL (ref 0.0–1.9)

## 2021-07-02 LAB — URINE CULTURE
MICRO NUMBER:: 12987230
Result:: NO GROWTH
SPECIMEN QUALITY:: ADEQUATE

## 2021-07-02 LAB — HIV ANTIBODY (ROUTINE TESTING W REFLEX): HIV 1&2 Ab, 4th Generation: NONREACTIVE

## 2021-07-03 ENCOUNTER — Encounter: Payer: Self-pay | Admitting: Internal Medicine

## 2021-07-03 NOTE — Assessment & Plan Note (Signed)
Resolved.  No longer taking cymbalta.

## 2021-07-03 NOTE — Assessment & Plan Note (Signed)
.    He has been abstinent

## 2021-12-06 ENCOUNTER — Ambulatory Visit
Admission: EM | Admit: 2021-12-06 | Discharge: 2021-12-06 | Disposition: A | Discharge status: Home / Self Care | Payer: BLUE CROSS/BLUE SHIELD | Attending: Internal Medicine | Admitting: Internal Medicine

## 2021-12-06 DIAGNOSIS — H6691 Otitis media, unspecified, right ear: Secondary | ICD-10-CM

## 2021-12-06 MED ORDER — FLUTICASONE PROPIONATE 50 MCG/ACT NA SUSP: 2.0000 | Freq: Every day | NASAL | 0 refills | Status: DC

## 2021-12-06 MED ORDER — AMOXICILLIN-POT CLAVULANATE 875-125 MG PO TABS: 1.0000 | ORAL_TABLET | Freq: Two times a day (BID) | ORAL | 0 refills | Status: DC

## 2021-12-06 NOTE — ED Triage Notes (Signed)
Pt c/o right ear pain x4days.  Pt states that he had multiple ear surgeries in the right ear.   Pt is worried about a possible ear infection.  Pt has been using OTC ear drops and peroxide to help with pain.

## 2021-12-06 NOTE — ED Provider Notes (Signed)
[facility]    CSN: [id number] Arrival date & time: 12/06/21  1102      History   Chief Complaint Chief Complaint  Patient presents with   Ear Pain    HPI Kyle Marshall is a 31 y.o. male who presents with R ear p[ain x 4 days. Has hx of having multiple surgeries of his R ear. Has been using gtts and peroxide to help with the pain. Had nose congestion that was not responding to his oral allergy meds last week and ran out of his Flonase. Has not had a fever, and denies cough. Follows with ENT about twice a year.     Past Medical History:  Diagnosis Date   Abdominal pain 02/12/2008   Allergic rhinitis 12/08/2014   Asthma    Concussion with loss of consciousness 10/10/2019   Difficulty hearing 02/09/2015   Seizure-like activity (HCC) 10/11/2018   Suspected COVID-19 virus infection 06/17/2019   Witnessed seizure-like activity (HCC) 10/14/2018    Patient Active Problem List   Diagnosis Date Noted   Sebaceous cyst 07/01/2021   Recurrent nephrolithiasis 07/01/2021   Hyperhidrosis of feet 05/31/2020   Attention and concentration deficit 05/31/2020   H/O renal calculi 05/31/2020   Essential hypertension 10/10/2019   Substance abuse in remission (HCC) 10/14/2018   Bilateral hand numbness 09/08/2018   Obesity 02/13/2016   OA (osteoarthritis) of knee 08/26/2015   Generalized anxiety disorder 06/06/2015   Generalized OA 06/06/2015   History of syncope 06/06/2015   Chronic pain 12/08/2014   Asthma 12/08/2014   Seasonal allergic rhinitis 12/08/2014    Past Surgical History:  Procedure Laterality Date   EAR TUBE REMOVAL     SHOULDER SURGERY Right 2012       Home Medications    Prior to Admission medications   Medication Sig Start Date End Date Taking? Authorizing Provider  albuterol (PROVENTIL HFA;VENTOLIN HFA) 108 (90 BASE) MCG/ACT inhaler Inhale 2 puffs into the lungs every 4 (four) hours as needed for wheezing or shortness of breath. Reported on 08/26/2015    Yes [provider]  aluminum chloride (DRYSOL) 20 % external solution Apply topically at bedtime. To feet 05/31/20  Yes Sherlene Shams, MD  amLODipine (NORVASC) 5 MG tablet Take 1 tablet (5 mg total) by mouth daily. 07/01/21  Yes Sherlene Shams, MD  amoxicillin-clavulanate (AUGMENTIN) 875-125 MG tablet Take 1 tablet by mouth every 12 (twelve) hours. 12/06/21  Yes Rodriguez-Southworth, Nettie Elm, PA-C  esomeprazole (NEXIUM) 40 MG capsule Take 40 mg by mouth daily at 12 noon. Reported on 08/26/2015   Yes [provider]  ibuprofen (ADVIL) 800 MG tablet TAKE 1 TABLET BY MOUTH EVERY 8 HOURS AS NEEDED 04/08/19  Yes Sherlene Shams, MD  levocetirizine (XYZAL) 5 MG tablet Take 1 tablet (5 mg total) by mouth every evening. 12/13/18  Yes Sherlene Shams, MD  fluticasone (FLONASE) 50 MCG/ACT nasal spray Place 2 sprays into both nostrils daily. 12/06/21   Rodriguez-Southworth, Nettie Elm, PA-C    Family History Family History  Problem Relation Age of Onset   Cancer Mother        breast   Atrial fibrillation Mother    Cancer Father        brain, lung     Social History Social History   Tobacco Use   Smoking status: Former   Smokeless tobacco: Never  Vaping Use   Vaping Use: Never used  Substance Use Topics   Alcohol use: Not Currently  Alcohol/week: 0.0 standard drinks of alcohol    Comment: seldom   Drug use: Not Currently    Types: Marijuana     Allergies   Vicodin [hydrocodone-acetaminophen]   Review of Systems Review of Systems  Constitutional:  Negative for chills and fever.  HENT:  Positive for congestion, dental problem, ear pain, hearing loss, postnasal drip and sneezing. Negative for ear discharge, rhinorrhea, sore throat and trouble swallowing.        Is undergoing treatment for dental implants   Eyes:  Negative for discharge.  Respiratory:  Negative for cough.   Skin:  Negative for rash.  Hematological:  Negative for adenopathy.     Physical Exam Triage  Vital Signs ED Triage Vitals  Enc Vitals Group     BP 12/06/21 1121 (!) 148/103     Pulse Rate 12/06/21 1121 86     Resp 12/06/21 1121 18     Temp 12/06/21 1121 98.3 F (36.8 C)     Temp src --      SpO2 12/06/21 1121 100 %     Weight 12/06/21 1118 190 lb (86.2 kg)     Height 12/06/21 1118 5\' 6"  (1.676 m)     Head Circumference --      Peak Flow --      Pain Score 12/06/21 1118 10     Pain Loc --      Pain Edu? --      Excl. in GC? --    No data found.  Updated Vital Signs BP (!) 148/103 (BP Location: Left Arm)   Pulse 86   Temp 98.3 F (36.8 C)   Resp 18   Ht 5\' 6"  (1.676 m)   Wt 190 lb (86.2 kg)   SpO2 100%   BMI 30.67 kg/m   Visual Acuity Right Eye Distance:   Left Eye Distance:   Bilateral Distance:    Right Eye Near:   Left Eye Near:    Bilateral Near:     Physical Exam Vitals and nursing note reviewed.  Constitutional:      General: He is not in acute distress.    Appearance: He is not toxic-appearing.  HENT:     Head: Normocephalic.     Right Ear: Ear canal and external ear normal. Tympanic membrane is scarred and erythematous.     Left Ear: Ear canal and external ear normal. Tympanic membrane is scarred.     Nose: Congestion present.  Eyes:     General: No scleral icterus.    Conjunctiva/sclera: Conjunctivae normal.  Pulmonary:     Effort: Pulmonary effort is normal.  Musculoskeletal:        General: Normal range of motion.     Cervical back: Neck supple.  Skin:    General: Skin is warm and dry.     Findings: No rash.  Neurological:     Mental Status: He is alert and oriented to person, place, and time.     Gait: Gait normal.  Psychiatric:        Mood and Affect: Mood normal.        Behavior: Behavior normal.        Thought Content: Thought content normal.        Judgment: Judgment normal.      UC Treatments / Results  Labs (all labs ordered are listed, but only abnormal results are displayed) Labs Reviewed - No data to  display  EKG   Radiology No results found.  Procedures Procedures (including critical care time)  Medications Ordered in UC Medications - No data to display  Initial Impression / Assessment and Plan / UC Course  I have reviewed the triage vital signs and the nursing notes.  Acute R OM Hx of Chronic OM  I placed him on Augmentin, and refilled his Flonase as noted.  Will continue Fu with ENT Final Clinical Impressions(s) / UC Diagnoses   Final diagnoses:  Acute otitis media, right   Discharge Instructions   None    ED Prescriptions     Medication Sig Dispense Auth. Provider   fluticasone (FLONASE) 50 MCG/ACT nasal spray Place 2 sprays into both nostrils daily. 18.2 g Rodriguez-Southworth, Nettie Elm, PA-C   amoxicillin-clavulanate (AUGMENTIN) 875-125 MG tablet Take 1 tablet by mouth every 12 (twelve) hours. 20 tablet Rodriguez-Southworth, Nettie Elm, PA-C      PDMP not reviewed this encounter.   Garey Ham, PA-C 12/06/21 1139

## 2022-01-01 ENCOUNTER — Telehealth: Payer: Self-pay | Admitting: Internal Medicine

## 2022-01-01 DIAGNOSIS — I10 Essential (primary) hypertension: Secondary | ICD-10-CM

## 2022-02-14 ENCOUNTER — Ambulatory Visit
Admission: EM | Admit: 2022-02-14 | Discharge: 2022-02-14 | Disposition: A | Discharge status: Home / Self Care | Payer: BLUE CROSS/BLUE SHIELD | Attending: Internal Medicine | Admitting: Internal Medicine

## 2022-02-14 DIAGNOSIS — H6692 Otitis media, unspecified, left ear: Secondary | ICD-10-CM

## 2022-02-14 MED ORDER — AMOXICILLIN-POT CLAVULANATE 875-125 MG PO TABS: 1.0000 | ORAL_TABLET | Freq: Two times a day (BID) | ORAL | 0 refills | Status: DC

## 2022-02-14 NOTE — ED Provider Notes (Signed)
[facility]    CSN: [id number] Arrival date & time: 02/14/22  1210      History   Chief Complaint Chief Complaint  Patient presents with   Ear Pain    HPI Kyle Marshall is a 31 y.o. male who presents with R ear pain and Face pain x 1 week. Denies having nose congestion or rhinitis. Has OM July this year  and saw ENT and his TM was still red, so he extended his Augmentin for 7 more days. Denies having a fever.     Past Medical History:  Diagnosis Date   Abdominal pain 02/12/2008   Allergic rhinitis 12/08/2014   Asthma    Concussion with loss of consciousness 10/10/2019   Difficulty hearing 02/09/2015   Seizure-like activity (HCC) 10/11/2018   Suspected COVID-19 virus infection 06/17/2019   Witnessed seizure-like activity (HCC) 10/14/2018    Patient Active Problem List   Diagnosis Date Noted   Sebaceous cyst 07/01/2021   Recurrent nephrolithiasis 07/01/2021   Hyperhidrosis of feet 05/31/2020   Attention and concentration deficit 05/31/2020   H/O renal calculi 05/31/2020   Essential hypertension 10/10/2019   Substance abuse in remission (HCC) 10/14/2018   Bilateral hand numbness 09/08/2018   Obesity 02/13/2016   OA (osteoarthritis) of knee 08/26/2015   Generalized anxiety disorder 06/06/2015   Generalized OA 06/06/2015   History of syncope 06/06/2015   Chronic pain 12/08/2014   Asthma 12/08/2014   Seasonal allergic rhinitis 12/08/2014    Past Surgical History:  Procedure Laterality Date   EAR TUBE REMOVAL     SHOULDER SURGERY Right 2012       Home Medications    Prior to Admission medications   Medication Sig Start Date End Date Taking? Authorizing Provider  albuterol (PROVENTIL HFA;VENTOLIN HFA) 108 (90 BASE) MCG/ACT inhaler Inhale 2 puffs into the lungs every 4 (four) hours as needed for wheezing or shortness of breath. Reported on 08/26/2015   Yes [provider]  aluminum chloride (DRYSOL) 20 % external solution Apply topically at  bedtime. To feet 05/31/20  Yes Sherlene Shams, MD  amLODipine (NORVASC) 5 MG tablet TAKE 1 TABLET (5 MG TOTAL) BY MOUTH DAILY. 01/01/22  Yes Worthy Rancher B, FNP  amoxicillin-clavulanate (AUGMENTIN) 875-125 MG tablet Take 1 tablet by mouth every 12 (twelve) hours. 02/14/22  Yes Rodriguez-Southworth, Nettie Elm, PA-C  esomeprazole (NEXIUM) 40 MG capsule Take 40 mg by mouth daily at 12 noon. Reported on 08/26/2015   Yes [provider]  fluticasone (FLONASE) 50 MCG/ACT nasal spray Place 2 sprays into both nostrils daily. 12/06/21  Yes Rodriguez-Southworth, Nettie Elm, PA-C  ibuprofen (ADVIL) 800 MG tablet TAKE 1 TABLET BY MOUTH EVERY 8 HOURS AS NEEDED 04/08/19  Yes Sherlene Shams, MD  levocetirizine (XYZAL) 5 MG tablet Take 1 tablet (5 mg total) by mouth every evening. 12/13/18  Yes Sherlene Shams, MD    Family History Family History  Problem Relation Age of Onset   Cancer Mother        breast   Atrial fibrillation Mother    Cancer Father        brain, lung     Social History Social History   Tobacco Use   Smoking status: Former   Smokeless tobacco: Never  Vaping Use   Vaping Use: Never used  Substance Use Topics   Alcohol use: Not Currently    Alcohol/week: 0.0 standard drinks of alcohol    Comment: seldom   Drug use: Not Currently  Types: Marijuana     Allergies   Vicodin [hydrocodone-acetaminophen]   Review of Systems Review of Systems  Constitutional:  Negative for chills and fever.  HENT:  Positive for ear pain. Negative for congestion, ear discharge and rhinorrhea.   Respiratory:  Negative for cough.   Hematological:  Negative for adenopathy.     Physical Exam Triage Vital Signs ED Triage Vitals  Enc Vitals Group     BP 02/14/22 1225 (!) 148/110     Pulse Rate 02/14/22 1225 79     Resp 02/14/22 1225 18     Temp 02/14/22 1225 98.4 F (36.9 C)     Temp Source 02/14/22 1225 Oral     SpO2 02/14/22 1225 97 %     Weight 02/14/22 1224 189 lb 14.4 oz (86.1 kg)      Height 02/14/22 1224 5\' 6"  (1.676 m)     Head Circumference --      Peak Flow --      Pain Score 02/14/22 1224 8     Pain Loc --      Pain Edu? --      Excl. in GC? --    No data found.  Updated Vital Signs BP (!) 148/110 (BP Location: Left Arm)   Pulse 79   Temp 98.4 F (36.9 C) (Oral)   Resp 18   Ht 5\' 6"  (1.676 m)   Wt 189 lb 14.4 oz (86.1 kg)   SpO2 97%   BMI 30.65 kg/m   Visual Acuity Right Eye Distance:   Left Eye Distance:   Bilateral Distance:    Right Eye Near:   Left Eye Near:    Bilateral Near:     Physical Exam Vitals and nursing note reviewed.  Constitutional:      General: He is not in acute distress.    Appearance: He is not toxic-appearing.  HENT:     Right Ear: Ear canal and external ear normal. Tympanic membrane is erythematous and retracted.     Left Ear: Tympanic membrane, ear canal and external ear normal.  Eyes:     General: No scleral icterus.    Conjunctiva/sclera: Conjunctivae normal.  Pulmonary:     Effort: Pulmonary effort is normal.  Musculoskeletal:        General: Normal range of motion.     Cervical back: Neck supple.  Lymphadenopathy:     Cervical: No cervical adenopathy.  Skin:    General: Skin is warm and dry.  Neurological:     Mental Status: He is alert and oriented to person, place, and time.     Gait: Gait normal.  Psychiatric:        Mood and Affect: Mood normal.        Behavior: Behavior normal.        Thought Content: Thought content normal.        Judgment: Judgment normal.      UC Treatments / Results  Labs (all labs ordered are listed, but only abnormal results are displayed) Labs Reviewed - No data to display  EKG   Radiology No results found.  Procedures Procedures (including critical care time)  Medications Ordered in UC Medications - No data to display  Initial Impression / Assessment and Plan / UC Course  I have reviewed the triage vital signs and the nursing notes.  Acute R OM,  recurrent I placed him on Augmentin as noted for 14 days Advised to FU with his ENT again.  Final  Clinical Impressions(s) / UC Diagnoses   Final diagnoses:  Acute otitis media, left     Discharge Instructions      Follow up with your ear nose and throat doctor     ED Prescriptions     Medication Sig Dispense Auth. Provider   amoxicillin-clavulanate (AUGMENTIN) 875-125 MG tablet Take 1 tablet by mouth every 12 (twelve) hours. 28 tablet Rodriguez-Southworth, Sunday Spillers, PA-C      PDMP not reviewed this encounter.   Shelby Mattocks, [place] 02/14/22 1252

## 2022-02-14 NOTE — Discharge Instructions (Signed)
Follow up with your ear nose and throat doctor

## 2022-02-14 NOTE — ED Triage Notes (Signed)
Pt c/o right ear pain and facial pain. x1week  Pt believes it is another ear infection.  Pt states that his ear started hurting while visiting the hospital.

## 2022-05-05 DIAGNOSIS — H6983 Other specified disorders of Eustachian tube, bilateral: Secondary | ICD-10-CM | POA: Diagnosis not present

## 2022-05-05 DIAGNOSIS — J301 Allergic rhinitis due to pollen: Secondary | ICD-10-CM | POA: Diagnosis not present

## 2022-06-08 DIAGNOSIS — Z79899 Other long term (current) drug therapy: Secondary | ICD-10-CM | POA: Diagnosis not present

## 2022-06-08 DIAGNOSIS — M25539 Pain in unspecified wrist: Secondary | ICD-10-CM | POA: Diagnosis not present

## 2022-06-08 DIAGNOSIS — I10 Essential (primary) hypertension: Secondary | ICD-10-CM | POA: Diagnosis not present

## 2022-06-08 DIAGNOSIS — S6991XA Unspecified injury of right wrist, hand and finger(s), initial encounter: Secondary | ICD-10-CM | POA: Diagnosis not present

## 2022-06-10 DIAGNOSIS — S6991XA Unspecified injury of right wrist, hand and finger(s), initial encounter: Secondary | ICD-10-CM | POA: Diagnosis not present

## 2022-06-24 DIAGNOSIS — M79644 Pain in right finger(s): Secondary | ICD-10-CM | POA: Diagnosis not present

## 2022-06-24 DIAGNOSIS — Z8781 Personal history of (healed) traumatic fracture: Secondary | ICD-10-CM | POA: Diagnosis not present

## 2022-06-24 DIAGNOSIS — Z9889 Other specified postprocedural states: Secondary | ICD-10-CM | POA: Diagnosis not present

## 2022-06-24 DIAGNOSIS — M79641 Pain in right hand: Secondary | ICD-10-CM | POA: Diagnosis not present

## 2022-06-24 DIAGNOSIS — S52121A Displaced fracture of head of right radius, initial encounter for closed fracture: Secondary | ICD-10-CM | POA: Diagnosis not present

## 2022-06-30 DIAGNOSIS — M79641 Pain in right hand: Secondary | ICD-10-CM | POA: Diagnosis not present

## 2022-06-30 DIAGNOSIS — M19031 Primary osteoarthritis, right wrist: Secondary | ICD-10-CM | POA: Diagnosis not present

## 2022-07-03 ENCOUNTER — Other Ambulatory Visit: Payer: Self-pay | Admitting: Family

## 2022-07-03 DIAGNOSIS — I10 Essential (primary) hypertension: Secondary | ICD-10-CM

## 2022-07-05 DIAGNOSIS — Z8781 Personal history of (healed) traumatic fracture: Secondary | ICD-10-CM | POA: Diagnosis not present

## 2022-07-11 ENCOUNTER — Other Ambulatory Visit: Payer: Self-pay

## 2022-07-11 DIAGNOSIS — I10 Essential (primary) hypertension: Secondary | ICD-10-CM

## 2022-07-11 MED ORDER — AMLODIPINE BESYLATE 5 MG PO TABS: 5.0000 mg | ORAL_TABLET | Freq: Every day | ORAL | 0 refills | Status: DC

## 2022-07-11 NOTE — Telephone Encounter (Signed)
Pt need a refill on amLODipine sent to [facility]

## 2022-07-11 NOTE — Telephone Encounter (Signed)
30 day refill sent - Pt sched for follow up 3/4, overdue

## 2022-07-25 ENCOUNTER — Ambulatory Visit: Payer: BLUE CROSS/BLUE SHIELD | Admitting: Internal Medicine

## 2022-07-25 DIAGNOSIS — Z9889 Other specified postprocedural states: Secondary | ICD-10-CM | POA: Diagnosis not present

## 2022-07-25 DIAGNOSIS — Z8781 Personal history of (healed) traumatic fracture: Secondary | ICD-10-CM | POA: Diagnosis not present

## 2022-07-25 DIAGNOSIS — M79644 Pain in right finger(s): Secondary | ICD-10-CM | POA: Diagnosis not present

## 2022-08-02 ENCOUNTER — Ambulatory Visit: Payer: BLUE CROSS/BLUE SHIELD | Admitting: Internal Medicine

## 2022-08-02 ENCOUNTER — Other Ambulatory Visit: Payer: Self-pay | Admitting: Internal Medicine

## 2022-08-02 DIAGNOSIS — I10 Essential (primary) hypertension: Secondary | ICD-10-CM

## 2022-08-03 DIAGNOSIS — R197 Diarrhea, unspecified: Secondary | ICD-10-CM | POA: Diagnosis not present

## 2022-08-03 DIAGNOSIS — S22080A Wedge compression fracture of T11-T12 vertebra, initial encounter for closed fracture: Secondary | ICD-10-CM | POA: Diagnosis not present

## 2022-08-03 DIAGNOSIS — R1111 Vomiting without nausea: Secondary | ICD-10-CM | POA: Diagnosis not present

## 2022-08-03 DIAGNOSIS — J45909 Unspecified asthma, uncomplicated: Secondary | ICD-10-CM | POA: Diagnosis not present

## 2022-08-03 DIAGNOSIS — S22000A Wedge compression fracture of unspecified thoracic vertebra, initial encounter for closed fracture: Secondary | ICD-10-CM | POA: Diagnosis not present

## 2022-08-03 DIAGNOSIS — R079 Chest pain, unspecified: Secondary | ICD-10-CM | POA: Diagnosis not present

## 2022-08-03 DIAGNOSIS — S32019A Unspecified fracture of first lumbar vertebra, initial encounter for closed fracture: Secondary | ICD-10-CM | POA: Diagnosis not present

## 2022-08-03 DIAGNOSIS — M4854XA Collapsed vertebra, not elsewhere classified, thoracic region, initial encounter for fracture: Secondary | ICD-10-CM | POA: Diagnosis not present

## 2022-08-03 DIAGNOSIS — S22089A Unspecified fracture of T11-T12 vertebra, initial encounter for closed fracture: Secondary | ICD-10-CM | POA: Diagnosis not present

## 2022-08-03 DIAGNOSIS — R112 Nausea with vomiting, unspecified: Secondary | ICD-10-CM | POA: Diagnosis not present

## 2022-08-03 DIAGNOSIS — Z7951 Long term (current) use of inhaled steroids: Secondary | ICD-10-CM | POA: Diagnosis not present

## 2022-08-03 DIAGNOSIS — S0990XA Unspecified injury of head, initial encounter: Secondary | ICD-10-CM | POA: Diagnosis not present

## 2022-08-03 DIAGNOSIS — S199XXA Unspecified injury of neck, initial encounter: Secondary | ICD-10-CM | POA: Diagnosis not present

## 2022-08-03 DIAGNOSIS — I10 Essential (primary) hypertension: Secondary | ICD-10-CM | POA: Diagnosis not present

## 2022-08-03 DIAGNOSIS — R29890 Loss of height: Secondary | ICD-10-CM | POA: Diagnosis not present

## 2022-08-03 DIAGNOSIS — Z886 Allergy status to analgesic agent status: Secondary | ICD-10-CM | POA: Diagnosis not present

## 2022-08-03 DIAGNOSIS — Z79899 Other long term (current) drug therapy: Secondary | ICD-10-CM | POA: Diagnosis not present

## 2022-08-03 DIAGNOSIS — S32010A Wedge compression fracture of first lumbar vertebra, initial encounter for closed fracture: Secondary | ICD-10-CM | POA: Diagnosis not present

## 2022-08-03 DIAGNOSIS — R110 Nausea: Secondary | ICD-10-CM | POA: Diagnosis not present

## 2022-08-03 DIAGNOSIS — M545 Low back pain, unspecified: Secondary | ICD-10-CM | POA: Diagnosis not present

## 2022-08-04 LAB — BASIC METABOLIC PANEL
BUN: 10 (ref 4–21)
Creatinine: 0.9 (ref 0.6–1.3)

## 2022-08-05 ENCOUNTER — Encounter: Payer: Self-pay | Admitting: Nurse Practitioner

## 2022-08-05 ENCOUNTER — Ambulatory Visit: Payer: BLUE CROSS/BLUE SHIELD | Admitting: Nurse Practitioner

## 2022-08-05 VITALS — BP 138/78 | HR 101 | Temp 99.3°F | Ht 66.0 in | Wt 195.0 lb

## 2022-08-05 DIAGNOSIS — I10 Essential (primary) hypertension: Secondary | ICD-10-CM | POA: Diagnosis not present

## 2022-08-05 DIAGNOSIS — S32010A Wedge compression fracture of first lumbar vertebra, initial encounter for closed fracture: Secondary | ICD-10-CM

## 2022-08-05 DIAGNOSIS — S32010S Wedge compression fracture of first lumbar vertebra, sequela: Secondary | ICD-10-CM

## 2022-08-05 MED ORDER — CYCLOBENZAPRINE HCL 10 MG PO TABS: 10.0000 mg | ORAL_TABLET | Freq: Three times a day (TID) | ORAL | 0 refills | Status: DC | PRN

## 2022-08-05 MED ORDER — OXYCODONE HCL 5 MG PO TABS: 5.0000 mg | ORAL_TABLET | Freq: Four times a day (QID) | ORAL | 0 refills | Status: DC | PRN

## 2022-08-05 MED ORDER — ONDANSETRON 4 MG PO TBDP: 4.0000 mg | ORAL_TABLET | Freq: Three times a day (TID) | ORAL | 0 refills | Status: AC | PRN

## 2022-08-05 MED ORDER — AMLODIPINE BESYLATE 5 MG PO TABS: 5.0000 mg | ORAL_TABLET | Freq: Every day | ORAL | 0 refills | Status: DC
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
## 2022-08-05 NOTE — Patient Instructions (Addendum)
Rx of oxycodone, flexeril, zofran and amlodipine sent to pharmacy.

## 2022-08-07 ENCOUNTER — Encounter: Payer: Self-pay | Admitting: Nurse Practitioner

## 2022-08-07 DIAGNOSIS — S32010S Wedge compression fracture of first lumbar vertebra, sequela: Secondary | ICD-10-CM | POA: Insufficient documentation

## 2022-08-07 NOTE — Assessment & Plan Note (Addendum)
Refilled oxycodone and Zofran. Started on Flexeril. Has been scheduled for consult with neurosurgeon at [facility].

## 2022-08-09 ENCOUNTER — Other Ambulatory Visit: Payer: Self-pay | Admitting: Internal Medicine

## 2022-08-09 ENCOUNTER — Telehealth: Payer: Self-pay | Admitting: Internal Medicine

## 2022-08-09 MED ORDER — OXYCODONE HCL 5 MG PO TABS: 5.0000 mg | ORAL_TABLET | Freq: Four times a day (QID) | ORAL | 0 refills | Status: AC | PRN

## 2022-08-09 NOTE — Telephone Encounter (Signed)
Spoke with pt to let him know that the refill has been sent in. Pt gave a verbal understanding.

## 2022-08-09 NOTE — Telephone Encounter (Signed)
Pt saw Toy Care, NP on 08/05/2022 and was prescribed a 5 day supply of oxycodone.
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
## 2022-08-10 DIAGNOSIS — S32010A Wedge compression fracture of first lumbar vertebra, initial encounter for closed fracture: Secondary | ICD-10-CM | POA: Diagnosis not present

## 2022-08-22 NOTE — Telephone Encounter (Signed)
Pt called in staying if he can have some muscle relaxer and pain meds sent over to his pharmacy?

## 2022-08-22 NOTE — Telephone Encounter (Signed)
Spoke to pt. I advised him to get something from Dr Derrel Nip, he would need an OV. Unfortunately, he appointments are full for the next 2 weeks. He could see someone else possibly. He has seen ortho once for paperwork. I suggested he reach out to them since they would be the ones doing his surgery. They should be the ones doing his pain meds. I also suggested he try [email or web address] to see if they could help him this evening if ortho does not.

## 2022-08-23 NOTE — Telephone Encounter (Addendum)
Pt would like to be called regarding medication for his back, ortho, and referral to the pain clinic

## 2022-08-24 NOTE — Telephone Encounter (Signed)
Spoke with pt and to let him know of the message below. Pt stopped me and said "can I ask you a serious question, what am I supposed to do about the pain". I advised the pt that Dr. Derrel Nip can not prescribe pain medications without having a face to face appt with pt. I scheduled pt for 09/02/2022 first available. Pt stated "so am I supposed to go find some or go to the ED". I advised the pt that if he was in that much pain that it would be best for him to go back to the ED for treatment. Pt stated I will do something because I'm about to have a mental break laying in this bed looking at the ceiling and my phone. Pt kept telling me that he is in so much pain, its hard for him to go to the bathroom by himself, he can't sleep, he can't eat, his stomach is messed up because he has taken so much tylenol and ibuprofen. Pt did also stated that he has been having to take two pain pills at a time to relieve the pain.

## 2022-08-24 NOTE — Telephone Encounter (Signed)
Spoke with pt, he was in tears stating that he is in a lot of pain, unable to sleep more than two to three hours at time due to the pain and feels like he "is about to have a mental break down". Pt stated that he reached out to his neurosurgeon and stated that they aren't able to prescribe him any pain medication that he will need to reach out to his PCP. Pt is aware that the CMA told him last week that the pain medication can not be refilled unless he is seen. Pt is scheduled to see neurosurgery on April 24th. He would like to see if he can get a refill of pain medication till then or get an urgent referral to a pain clinic.

## 2022-09-02 ENCOUNTER — Ambulatory Visit: Payer: BLUE CROSS/BLUE SHIELD | Admitting: Internal Medicine

## 2022-09-14 DIAGNOSIS — M549 Dorsalgia, unspecified: Secondary | ICD-10-CM | POA: Diagnosis not present

## 2022-09-14 DIAGNOSIS — S32010A Wedge compression fracture of first lumbar vertebra, initial encounter for closed fracture: Secondary | ICD-10-CM | POA: Diagnosis not present

## 2022-09-14 DIAGNOSIS — S32010D Wedge compression fracture of first lumbar vertebra, subsequent encounter for fracture with routine healing: Secondary | ICD-10-CM | POA: Diagnosis not present

## 2022-09-14 DIAGNOSIS — Z885 Allergy status to narcotic agent status: Secondary | ICD-10-CM | POA: Diagnosis not present

## 2022-10-10 ENCOUNTER — Ambulatory Visit: Payer: BLUE CROSS/BLUE SHIELD | Admitting: Internal Medicine

## 2022-10-20 DIAGNOSIS — M5134 Other intervertebral disc degeneration, thoracic region: Secondary | ICD-10-CM | POA: Diagnosis not present

## 2022-10-20 DIAGNOSIS — S32010A Wedge compression fracture of first lumbar vertebra, initial encounter for closed fracture: Secondary | ICD-10-CM | POA: Diagnosis not present

## 2022-10-20 DIAGNOSIS — M5124 Other intervertebral disc displacement, thoracic region: Secondary | ICD-10-CM | POA: Diagnosis not present

## 2022-10-20 DIAGNOSIS — X58XXXA Exposure to other specified factors, initial encounter: Secondary | ICD-10-CM | POA: Diagnosis not present

## 2022-10-26 ENCOUNTER — Encounter: Payer: Self-pay | Admitting: Internal Medicine

## 2022-10-26 ENCOUNTER — Ambulatory Visit: Payer: BLUE CROSS/BLUE SHIELD | Admitting: Internal Medicine

## 2022-10-26 VITALS — BP 128/88 | HR 105 | Ht 66.0 in | Wt 201.8 lb

## 2022-10-26 DIAGNOSIS — S32010S Wedge compression fracture of first lumbar vertebra, sequela: Secondary | ICD-10-CM

## 2022-10-26 DIAGNOSIS — S32010D Wedge compression fracture of first lumbar vertebra, subsequent encounter for fracture with routine healing: Secondary | ICD-10-CM | POA: Diagnosis not present

## 2022-10-26 DIAGNOSIS — I10 Essential (primary) hypertension: Secondary | ICD-10-CM

## 2022-10-26 MED ORDER — DULOXETINE HCL 30 MG PO CPEP: 30.0000 mg | ORAL_CAPSULE | Freq: Every day | ORAL | 2 refills | Status: DC

## 2022-10-26 MED ORDER — BUTALBITAL-APAP-CAFFEINE 50-325-40 MG PO TABS: 1.0000 | ORAL_TABLET | Freq: Four times a day (QID) | ORAL | 0 refills | Status: DC | PRN

## 2022-10-26 MED ORDER — CELECOXIB 200 MG PO CAPS: 200.0000 mg | ORAL_CAPSULE | Freq: Two times a day (BID) | ORAL | 2 refills | Status: DC

## 2022-10-26 MED ORDER — AMLODIPINE BESYLATE 5 MG PO TABS: 5.0000 mg | ORAL_TABLET | Freq: Every day | ORAL | 2 refills | Status: DC

## 2022-10-26 NOTE — Patient Instructions (Addendum)
Continue using tylenol 1000 mg every 12 hours  Adding celebrex 200 mg every 12 hours INSTEAD OF IBUPROFEN  Adding fioricet for pain not relived with tylenol /celebrex  Refilling flexeril , your muscle relaxer   Adding cymbalta , an antidepressant,  that is helpful for chronic pain
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
## 2022-10-29 NOTE — Assessment & Plan Note (Signed)
His fracture occurred nearly 3 months ago and he reports considerable persistent pain .  Given his remote  history of drug abuse, I have grave reservations about refilling opioids,  since he is under the care of a neurosurgeon who deferred prescribing then.  Will change ibuprofen to celebrex , continue tyleno and refilling flexeril.  Adding fioricet for twice daily use and cymbalta

## 2022-10-29 NOTE — Assessment & Plan Note (Addendum)
.  Well controlled on current regimen of amlodipine 5 mg daily.   Renal function stable, no changes today.   Lab Results  Component Value Date   CREATININE 0.9 08/04/2022

## 2022-11-02 DIAGNOSIS — S32010A Wedge compression fracture of first lumbar vertebra, initial encounter for closed fracture: Secondary | ICD-10-CM | POA: Diagnosis not present

## 2022-11-02 DIAGNOSIS — S22089D Unspecified fracture of T11-T12 vertebra, subsequent encounter for fracture with routine healing: Secondary | ICD-10-CM | POA: Diagnosis not present

## 2022-11-02 DIAGNOSIS — X58XXXA Exposure to other specified factors, initial encounter: Secondary | ICD-10-CM | POA: Diagnosis not present

## 2022-11-02 DIAGNOSIS — M549 Dorsalgia, unspecified: Secondary | ICD-10-CM | POA: Diagnosis not present

## 2022-11-02 DIAGNOSIS — S32010D Wedge compression fracture of first lumbar vertebra, subsequent encounter for fracture with routine healing: Secondary | ICD-10-CM | POA: Diagnosis not present

## 2022-11-16 ENCOUNTER — Telehealth: Payer: Self-pay | Admitting: Internal Medicine

## 2022-11-16 NOTE — Telephone Encounter (Signed)
Pt would like a transfer of care from Dr Darrick Huntsman to Dr Jon Billings. Please advise.

## 2022-12-13 DIAGNOSIS — S32010A Wedge compression fracture of first lumbar vertebra, initial encounter for closed fracture: Secondary | ICD-10-CM | POA: Diagnosis not present

## 2022-12-13 DIAGNOSIS — M5459 Other low back pain: Secondary | ICD-10-CM | POA: Diagnosis not present

## 2022-12-28 ENCOUNTER — Encounter: Payer: Self-pay | Admitting: Internal Medicine

## 2022-12-28 ENCOUNTER — Ambulatory Visit (INDEPENDENT_AMBULATORY_CARE_PROVIDER_SITE_OTHER): Payer: Self-pay | Admitting: Internal Medicine

## 2022-12-28 VITALS — BP 144/100 | HR 132 | Temp 99.3°F | Ht 66.0 in | Wt 198.0 lb

## 2022-12-28 DIAGNOSIS — Z5986 Financial insecurity: Secondary | ICD-10-CM

## 2022-12-28 DIAGNOSIS — F411 Generalized anxiety disorder: Secondary | ICD-10-CM | POA: Diagnosis not present

## 2022-12-28 DIAGNOSIS — Z87898 Personal history of other specified conditions: Secondary | ICD-10-CM

## 2022-12-28 DIAGNOSIS — Z820 Family history of epilepsy and other diseases of the nervous system: Secondary | ICD-10-CM

## 2022-12-28 DIAGNOSIS — Z8249 Family history of ischemic heart disease and other diseases of the circulatory system: Secondary | ICD-10-CM

## 2022-12-28 DIAGNOSIS — Z79899 Other long term (current) drug therapy: Secondary | ICD-10-CM

## 2022-12-28 DIAGNOSIS — Z5987 Material hardship due to limited financial resources, not elsewhere classified: Secondary | ICD-10-CM

## 2022-12-28 DIAGNOSIS — M159 Polyosteoarthritis, unspecified: Secondary | ICD-10-CM

## 2022-12-28 DIAGNOSIS — F1911 Other psychoactive substance abuse, in remission: Secondary | ICD-10-CM

## 2022-12-28 DIAGNOSIS — S32010S Wedge compression fracture of first lumbar vertebra, sequela: Secondary | ICD-10-CM | POA: Diagnosis not present

## 2022-12-28 DIAGNOSIS — F1120 Opioid dependence, uncomplicated: Secondary | ICD-10-CM

## 2022-12-28 DIAGNOSIS — I10 Essential (primary) hypertension: Secondary | ICD-10-CM

## 2022-12-28 MED ORDER — BUPRENORPHINE HCL-NALOXONE HCL 8-2 MG SL SUBL: 2.0000 | SUBLINGUAL_TABLET | Freq: Every day | SUBLINGUAL | 0 refills | Status: DC

## 2022-12-28 MED ORDER — NALOXONE HCL 4 MG/0.1ML NA LIQD: NASAL | 11 refills | Status: DC
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
## 2022-12-28 NOTE — Assessment & Plan Note (Signed)
Due to a history of multiple fractures (T11, T12, L1) and scoliosis, he experiences chronic pain and neuropathy, reporting severe pain and tingling in his feet, worsened by a shoe block. With a history of substance misuse, we will start Suboxone for pain management at 4mg  twice daily and refer him to pain management for additional non-opioid approaches.  This was after extensive discussion of how opioids are not a good choice for chronic nonmalignant pain and how he has history of substance use disorder making suboxone a safer choice.

## 2022-12-28 NOTE — Assessment & Plan Note (Addendum)
Related with lot of ear troubles and getting vertigo a lot but this doesn't seem related, poorly described episodes though  He has had fainting episodes since childhood, with recent ones causing blackouts, and report vertigo, though distinct from the fainting episodes. We will refer him to neurology and cardiology for further evaluation, considering his family history of heart conditions.Marland Kitchen
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
## 2022-12-28 NOTE — Assessment & Plan Note (Signed)
Will focus on getting pain, anxiety, stress managed and see if we still need medication(s) after that. Uncontrolled today Will do close follow up.

## 2022-12-28 NOTE — Assessment & Plan Note (Signed)
Patient not concerned about his anxiety levels

## 2022-12-28 NOTE — Assessment & Plan Note (Signed)
With a history of substance misuse now in remission and previous use of Suboxone, we will continue Suboxone for pain management and to prevent relapse into substance misuse.

## 2023-01-04 ENCOUNTER — Ambulatory Visit: Payer: PRIVATE HEALTH INSURANCE | Admitting: Internal Medicine

## 2023-01-20 ENCOUNTER — Ambulatory Visit: Payer: Self-pay | Admitting: Internal Medicine

## 2023-02-13 ENCOUNTER — Telehealth: Payer: Self-pay | Admitting: Internal Medicine

## 2023-02-13 NOTE — Telephone Encounter (Signed)
Prescription Request  02/13/2023  LOV: 12/28/2022  What is the name of the medication or equipment? amLODipine (NORVASC) 5 MG tablet  loratadine (CLARITIN) 10 MG tablet    Have you contacted your pharmacy to request a refill? Yes   Which pharmacy would you like this sent to?  [id number] - [place], [place] - [address] [place] [address] Phone: [phone] Fax: [phone]    Patient notified that their request is being sent to the clinical staff for review and that they should receive a response within 2 business days.   Please advise at Mobile [phone] (mobile)

## 2023-02-16 ENCOUNTER — Other Ambulatory Visit: Payer: Self-pay

## 2023-02-16 DIAGNOSIS — I10 Essential (primary) hypertension: Secondary | ICD-10-CM

## 2023-02-16 MED ORDER — LORATADINE 10 MG PO TABS: 10.0000 mg | ORAL_TABLET | Freq: Every day | ORAL | 1 refills | Status: DC | PRN

## 2023-02-16 MED ORDER — AMLODIPINE BESYLATE 5 MG PO TABS: 5.0000 mg | ORAL_TABLET | Freq: Every day | ORAL | 2 refills | Status: DC

## 2023-02-16 NOTE — Telephone Encounter (Signed)
Sent 90 day refills to requested pharmacy.

## 2023-05-22 ENCOUNTER — Other Ambulatory Visit: Payer: Self-pay | Admitting: Internal Medicine

## 2023-05-22 DIAGNOSIS — I10 Essential (primary) hypertension: Secondary | ICD-10-CM

## 2023-05-22 MED ORDER — AMLODIPINE BESYLATE 5 MG PO TABS: 5.0000 mg | ORAL_TABLET | Freq: Every day | ORAL | 2 refills | Status: DC

## 2023-05-22 NOTE — Telephone Encounter (Signed)
Copied from CRM [id number]. Topic: Clinical - Medication Refill >> May 22, 2023 12:42 PM Sim Boast F wrote: Most Recent Primary Care Visit:  Provider: Lula Olszewski  Department: [facility]  Visit Type: TRANSFER OF CARE  Date: 12/28/2022  Medication: amLODipine   Has the patient contacted their pharmacy? No (Agent: If no, request that the patient contact the pharmacy for the refill. If patient does not wish to contact the pharmacy document the reason why and proceed with request.) (Agent: If yes, when and what did the pharmacy advise?)  Is this the correct pharmacy for this prescription? Yes If no, delete pharmacy and type the correct one.  This is the patient's preferred pharmacy:    [id number] - [place], [place] - [address] [place] [address] Phone: [phone] Fax: [phone]     Has the prescription been filled recently? Yes  Is the patient out of the medication? Yes  Has the patient been seen for an appointment in the last year OR does the patient have an upcoming appointment? Yes  Can we respond through MyChart? Yes  Agent: Please be advised that Rx refills may take up to 3 business days. We ask that you follow-up with your pharmacy.

## 2023-05-22 NOTE — Telephone Encounter (Signed)
Copied from CRM [id number]. Topic: Clinical - Medication Refill >> May 22, 2023 12:42 PM Sim Boast F wrote: Most Recent Primary Care Visit:  Provider: Lula Olszewski  Department: [facility]  Visit Type: TRANSFER OF CARE  Date: 12/28/2022  Medication: amLODipine   Has the patient contacted their pharmacy? No (Agent: If no, request that the patient contact the pharmacy for the refill. If patient does not wish to contact the pharmacy document the reason why and proceed with request.) (Agent: If yes, when and what did the pharmacy advise?)  Is this the correct pharmacy for this prescription? Yes If no, delete pharmacy and type the correct one.  This is the patient's preferred pharmacy:    [id number] - [place], [place] - [address] [place] [address] Phone: [phone] Fax: [phone]     Has the prescription been filled recently? Yes  Is the patient out of the medication? Yes  Has the patient been seen for an appointment in the last year OR does the patient have an upcoming appointment? Yes  Can we respond through MyChart? Yes  Agent: Please be advised that Rx refills may take up to 3 business days. We ask that you follow-up with your pharmacy.   Dr. Jon Billings sent this prescription in today. Called the pharmacy to confirm and informed patient.

## 2023-05-31 ENCOUNTER — Ambulatory Visit: Payer: Medicaid Other | Admitting: Internal Medicine

## 2023-06-07 ENCOUNTER — Other Ambulatory Visit: Payer: Self-pay | Admitting: Internal Medicine

## 2023-06-07 ENCOUNTER — Ambulatory Visit: Payer: Medicaid Other | Admitting: Internal Medicine

## 2023-06-07 ENCOUNTER — Encounter: Payer: Self-pay | Admitting: Internal Medicine

## 2023-06-07 ENCOUNTER — Ambulatory Visit: Payer: Medicaid Other

## 2023-06-07 VITALS — BP 128/87 | HR 95 | Temp 98.8°F | Ht 66.0 in | Wt 222.6 lb

## 2023-06-07 DIAGNOSIS — R519 Headache, unspecified: Secondary | ICD-10-CM

## 2023-06-07 DIAGNOSIS — K219 Gastro-esophageal reflux disease without esophagitis: Secondary | ICD-10-CM

## 2023-06-07 DIAGNOSIS — M62838 Other muscle spasm: Secondary | ICD-10-CM

## 2023-06-07 DIAGNOSIS — E669 Obesity, unspecified: Secondary | ICD-10-CM

## 2023-06-07 DIAGNOSIS — G8929 Other chronic pain: Secondary | ICD-10-CM | POA: Diagnosis not present

## 2023-06-07 DIAGNOSIS — J45909 Unspecified asthma, uncomplicated: Secondary | ICD-10-CM

## 2023-06-07 DIAGNOSIS — Z87898 Personal history of other specified conditions: Secondary | ICD-10-CM

## 2023-06-07 DIAGNOSIS — M549 Dorsalgia, unspecified: Secondary | ICD-10-CM

## 2023-06-07 DIAGNOSIS — J3089 Other allergic rhinitis: Secondary | ICD-10-CM

## 2023-06-07 DIAGNOSIS — L723 Sebaceous cyst: Secondary | ICD-10-CM

## 2023-06-07 DIAGNOSIS — G4733 Obstructive sleep apnea (adult) (pediatric): Secondary | ICD-10-CM

## 2023-06-07 DIAGNOSIS — Z87442 Personal history of urinary calculi: Secondary | ICD-10-CM

## 2023-06-07 DIAGNOSIS — Z79899 Other long term (current) drug therapy: Secondary | ICD-10-CM

## 2023-06-07 DIAGNOSIS — S32010S Wedge compression fracture of first lumbar vertebra, sequela: Secondary | ICD-10-CM | POA: Diagnosis not present

## 2023-06-07 DIAGNOSIS — M25552 Pain in left hip: Secondary | ICD-10-CM | POA: Insufficient documentation

## 2023-06-07 DIAGNOSIS — F411 Generalized anxiety disorder: Secondary | ICD-10-CM

## 2023-06-07 DIAGNOSIS — I10 Essential (primary) hypertension: Secondary | ICD-10-CM

## 2023-06-07 DIAGNOSIS — J301 Allergic rhinitis due to pollen: Secondary | ICD-10-CM

## 2023-06-07 DIAGNOSIS — M159 Polyosteoarthritis, unspecified: Secondary | ICD-10-CM

## 2023-06-07 DIAGNOSIS — N200 Calculus of kidney: Secondary | ICD-10-CM

## 2023-06-07 DIAGNOSIS — J020 Streptococcal pharyngitis: Secondary | ICD-10-CM

## 2023-06-07 DIAGNOSIS — Z5987 Material hardship due to limited financial resources, not elsewhere classified: Secondary | ICD-10-CM

## 2023-06-07 DIAGNOSIS — L74513 Primary focal hyperhidrosis, soles: Secondary | ICD-10-CM

## 2023-06-07 DIAGNOSIS — Z5986 Financial insecurity: Secondary | ICD-10-CM

## 2023-06-07 MED ORDER — ESOMEPRAZOLE MAGNESIUM 40 MG PO CPDR: 40.0000 mg | DELAYED_RELEASE_CAPSULE | Freq: Every day | ORAL | 0 refills | Status: DC

## 2023-06-07 MED ORDER — NALOXONE HCL 4 MG/0.1ML NA LIQD: NASAL | 11 refills | Status: AC

## 2023-06-07 MED ORDER — LEVOCETIRIZINE DIHYDROCHLORIDE 5 MG PO TABS: 5.0000 mg | ORAL_TABLET | Freq: Every evening | ORAL | 3 refills | Status: AC

## 2023-06-07 MED ORDER — AMOXICILLIN 500 MG PO CAPS: 500.0000 mg | ORAL_CAPSULE | Freq: Three times a day (TID) | ORAL | 0 refills | Status: AC

## 2023-06-07 MED ORDER — XOPENEX HFA 45 MCG/ACT IN AERO: 1.0000 | INHALATION_SPRAY | Freq: Four times a day (QID) | RESPIRATORY_TRACT | 11 refills | Status: AC | PRN

## 2023-06-07 MED ORDER — FLUTICASONE PROPIONATE 50 MCG/ACT NA SUSP: 2.0000 | Freq: Every day | NASAL | 0 refills | Status: DC

## 2023-06-07 MED ORDER — CELECOXIB 200 MG PO CAPS: 200.0000 mg | ORAL_CAPSULE | Freq: Two times a day (BID) | ORAL | 2 refills | Status: DC

## 2023-06-07 MED ORDER — CYCLOBENZAPRINE HCL 10 MG PO TABS: 10.0000 mg | ORAL_TABLET | Freq: Three times a day (TID) | ORAL | 1 refills | Status: DC | PRN

## 2023-06-07 MED ORDER — ALUMINUM CHLORIDE 20 % EX SOLN: Freq: Every day | CUTANEOUS | 5 refills | Status: AC
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
## 2023-06-07 NOTE — Assessment & Plan Note (Signed)
 Needs more drysol

## 2023-06-07 NOTE — Assessment & Plan Note (Signed)
 Associated with wife-witnessed apneas

## 2023-06-07 NOTE — Assessment & Plan Note (Signed)
 BMI Readings from Last 4 Encounters:  06/07/23 35.93 kg/m  12/28/22 31.96 kg/m  10/26/22 32.57 kg/m  08/05/22 31.47 kg/m  Worsening with arthritis.

## 2023-06-07 NOTE — Assessment & Plan Note (Signed)
 Hypertension Hypertension is managed with amlodipine , and compliance with treatment is reported. Amlodipine  is continued.

## 2023-06-07 NOTE — Patient Instructions (Addendum)
 VISIT SUMMARY:  During today's visit, we discussed your chronic pain, sleep disturbances, blood pressure management, allergies, and medication needs. We also reviewed your general health maintenance and made plans for follow-up care.  YOUR PLAN:  -CHRONIC PAIN SYNDROME: Chronic pain syndrome is long-lasting pain that affects your daily life and activities. Your pain is likely worsened by past accidents and scoliosis. We have prescribed Celebrex  for arthritis pain and Flexeril  for muscle relaxation. We will refer you to a spine specialist and order an x-ray of your mid and low spine and left hip. We are also considering a referral to a chiropractor. We will avoid Suboxone  due to cost and your preference.  -SLEEP APNEA: Sleep apnea is a condition where you stop breathing for short periods during sleep, leading to poor sleep quality and other health risks. Your symptoms include morning headaches, snoring, and waking up gasping for air. We discussed the risks of untreated sleep apnea, such as dementia and heart issues. We will urgently refer you to a sleep specialist for a sleep study.  -HYPERTENSION: Hypertension is high blood pressure, which can lead to serious health problems if not managed. You are currently managing it with amlodipine , and we will continue this medication.  -ALLERGIC RHINITIS: Allergic rhinitis is an allergic reaction that causes sneezing, congestion, and a runny nose. We recommend using Flonase  Sensimist for nasal congestion and have prescribed Xyzal  as a better alternative to Claritin .  -MEDICATION MANAGEMENT: We understand that financial constraints and insurance issues have complicated your medication management. We have discontinued Suboxone  and Cymbalta . We have prescribed Naloxone  spray (Narcan ) for emergencies, Nexium  for gastrointestinal symptoms, and Xopenex  as a replacement for your albuterol inhaler. We discussed ensuring that your medications are affordable and covered by  Medicaid.  -GENERAL HEALTH MAINTENANCE: We discussed the importance of regular follow-ups and medication adherence. We will prioritize ensuring your medications are affordable and covered by Medicaid.  INSTRUCTIONS:  Please follow up in three months. If a video visit is more convenient, let us  know. Contact our office if you do not hear from the sleep study within a week.  It was a pleasure seeing you today! Your health and satisfaction are our top priorities.  Scherrie Curt, MD  Your Providers PCP: Anthon Kins, MD,  [phone]) Referring Provider: Anthon Kins, MD,  [phone])     NEXT STEPS: [x]  Early Intervention: Schedule sooner appointment, call our on-call services, or go to emergency room if there is any significant Increase in pain or discomfort New or worsening symptoms Sudden or severe changes in your health [x]  Flexible Follow-Up: We recommend a Return in about 3 months (around 09/05/2023) for chronic disease monitoring and management, disability, weight loss med management. for optimal routine care. This allows for progress monitoring and treatment adjustments. [x]  Preventive Care: Schedule your annual preventive care visit! It's typically covered by insurance and helps identify potential health issues early. [x]  Lab & X-ray Appointments: Incomplete tests scheduled today, or call to schedule. X-rays:  [facility] (M-F, 8:30am-noon or 1pm-5pm). [x]  Medical Information Release: Sign a release form at front desk to obtain relevant medical information we don't have.  MAKING THE MOST OF OUR FOCUSED 20 MINUTE APPOINTMENTS: [x]   Clearly state your top concerns at the beginning of the visit to focus our discussion [x]   If you anticipate you will need more time, please inform the front desk during scheduling - we can book multiple appointments in the same week. [x]   If you have transportation  problems- use our convenient video appointments or ask about  transportation support. [x]   We can get down to business faster if you use MyChart to update information before the visit and submit non-urgent questions before your visit. Thank you for taking the time to provide details through MyChart.  Let our nurse know and she can import this information into your encounter documents.  Arrival and Wait Times: [x]   Arriving on time ensures that everyone receives prompt attention. [x]   Early morning (8a) and afternoon (1p) appointments tend to have shortest wait times. [x]   Unfortunately, we cannot delay appointments for late arrivals or hold slots during phone calls.  Getting Answers and Following Up [x]   Simple Questions & Concerns: For quick questions or basic follow-up after your visit, reach us  at [phone] or MyChart messaging. [x]   Complex Concerns: If your concern is more complex, scheduling an appointment might be best. Discuss this with the staff to find the most suitable option. [x]   Lab & Imaging Results: We'll contact you directly if results are abnormal or you don't use MyChart. Most normal results will be on MyChart within 2-3 business days, with a review message from Dr. Boston Byers. Haven't heard back in 2 weeks? Need results sooner? Contact us  at [phone]. [x]   Referrals: Our referral coordinator will manage specialist referrals. The specialist's office should contact you within 2 weeks to schedule an appointment. Call us  if you haven't heard from them after 2 weeks.  Staying Connected [x]   MyChart: Activate your MyChart for the fastest way to access results and message us . See the last page of this paperwork for instructions on how to activate.  Bring to Your Next Appointment [x]   Medications: Please bring all your medication bottles to your next appointment to ensure we have an accurate record of your prescriptions. [x]   Health Diaries: If you're monitoring any health conditions at home, keeping a diary of your readings can be very  helpful for discussions at your next appointment.  Billing [x]   X-ray & Lab Orders: These are billed by separate companies. Contact the invoicing company directly for questions or concerns. [x]   Visit Charges: Discuss any billing inquiries with our administrative services team.  Your Satisfaction Matters [x]   Share Your Experience: We strive for your satisfaction! If you have any complaints, or preferably compliments, please let Dr. Boston Byers know directly or contact our Practice Administrators, Olinda Bertrand or [employer], by asking at the front desk.   Reviewing Your Records [x]   Review this early draft of your clinical encounter notes below and the final encounter summary tomorrow on MyChart after its been completed.  All orders placed so far are visible here: Chronic intractable pain -     AMB Referral [facility]  Compression fracture of L1 lumbar vertebra, sequela -     Naloxone  HCl; Deliver nasally as directed if poorly responsive or non responsive.  Dispense: 1 each; Refill: 11 -     AMB Referral [facility]  Sebaceous cyst -     AMB Referral [facility]  Seasonal allergic rhinitis due to pollen -     Levocetirizine Dihydrochloride ; Take 1 tablet (5 mg total) by mouth every evening.  Dispense: 90 tablet; Refill: 3 -     Fluticasone  Propionate; Place 2 sprays into both nostrils daily.  Dispense: 18.2 g; Refill: 0 -     AMB Referral [facility]  Recurrent nephrolithiasis -     AMB Referral [facility]  Obesity with serious comorbidity, unspecified class, unspecified obesity type -     AMB Referral [facility]  Hyperhidrosis of feet -     Aluminum  Chloride; Apply topically at bedtime. To feet  Dispense: 35 mL; Refill: 5 -     AMB Referral [facility]  History of syncope -     AMB Referral [facility]  High risk medication use -     AMB Referral [facility]  H/O renal calculi -     AMB  Referral [facility]  Generalized OA -     AMB Referral [facility]  Generalized anxiety disorder -     Celecoxib ; Take 1 capsule (200 mg total) by mouth 2 (two) times daily.  Dispense: 60 capsule; Refill: 2 -     AMB Referral [facility]  Financial insecurity due to medical expenses -     AMB Referral [facility]  Essential hypertension -     AMB Referral [facility]  Uncomplicated asthma, unspecified asthma severity, unspecified whether persistent -     Xopenex  HFA; Inhale 1 puff into the lungs every 6 (six) hours as needed for wheezing.  Dispense: 45 g; Refill: 11 -     AMB Referral [facility]  Muscle spasm -     Cyclobenzaprine  HCl; Take 1 tablet (10 mg total) by mouth 3 (three) times daily as needed for muscle spasms.  Dispense: 60 tablet; Refill: 1 -     AMB Referral [facility]  Gastroesophageal reflux disease without esophagitis -     Esomeprazole  Magnesium ; Take 1 capsule (40 mg total) by mouth daily at 12 noon. Reported on 08/26/2015  Dispense: 90 capsule; Refill: 0 -     AMB Referral [facility]  Strep pharyngitis -     Amoxicillin ; Take 1 capsule (500 mg total) by mouth 3 (three) times daily for 10 days.  Dispense: 30 capsule; Refill: 0 -     AMB Referral [facility]  Morning headache -     Ambulatory referral to Sleep Studies -     AMB Referral [facility]  OSA (obstructive sleep apnea) -     Ambulatory referral to Sleep Studies -     AMB Referral [facility]  Pain of left hip -     DG HIP UNILAT W OR W/O PELVIS MIN 4 VIEWS LEFT; Future -     AMB Referral [facility]  Intractable back pain -     DG Thoracic Spine W/Swimmers; Future -     DG Lumbar Spine Complete; Future -     AMB Referral [facility]  Allergic rhinitis due to other allergic trigger, unspecified seasonality -     AMB Referral [facility]  Material hardship due to  limited financial resources -     AMB Referral [facility]

## 2023-06-07 NOTE — Assessment & Plan Note (Signed)
 Doesn't want to get back on suboxone 

## 2023-06-07 NOTE — Assessment & Plan Note (Signed)
severe

## 2023-06-07 NOTE — Assessment & Plan Note (Signed)
 Severe cough and shortness of breath in am- shift to xopenex 

## 2023-06-07 NOTE — Assessment & Plan Note (Signed)
 No recurrence.
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
## 2023-06-07 NOTE — Assessment & Plan Note (Signed)
 Refills medication(s) severe chronic uncontrolled
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
## 2023-06-07 NOTE — Assessment & Plan Note (Signed)
 Remote, severe back pain

## 2023-06-07 NOTE — Assessment & Plan Note (Signed)
 He is not interested treatment(s) at this time, mentioned [facility]

## 2023-06-07 NOTE — Assessment & Plan Note (Signed)
 Even 4$ co-pay stressful, placed [facility] referral

## 2023-06-12 ENCOUNTER — Encounter: Payer: Self-pay | Admitting: Internal Medicine
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
## 2023-06-24 ENCOUNTER — Encounter: Payer: Self-pay | Admitting: Emergency Medicine

## 2023-06-24 ENCOUNTER — Ambulatory Visit
Admission: EM | Admit: 2023-06-24 | Discharge: 2023-06-24 | Disposition: A | Discharge status: Home / Self Care | Payer: Medicaid Other | Attending: Emergency Medicine | Admitting: Emergency Medicine

## 2023-06-24 DIAGNOSIS — R6889 Other general symptoms and signs: Secondary | ICD-10-CM | POA: Insufficient documentation

## 2023-06-24 DIAGNOSIS — R051 Acute cough: Secondary | ICD-10-CM | POA: Diagnosis present

## 2023-06-24 DIAGNOSIS — H6691 Otitis media, unspecified, right ear: Secondary | ICD-10-CM | POA: Diagnosis present

## 2023-06-24 LAB — RESP PANEL BY RT-PCR (FLU A&B, COVID) ARPGX2
Influenza A by PCR: NEGATIVE
Influenza B by PCR: NEGATIVE
SARS Coronavirus 2 by RT PCR: NEGATIVE

## 2023-06-24 MED ORDER — AMOXICILLIN-POT CLAVULANATE 875-125 MG PO TABS: 1.0000 | ORAL_TABLET | Freq: Two times a day (BID) | ORAL | 0 refills | Status: DC

## 2023-06-24 NOTE — ED Triage Notes (Signed)
Patient states that he has had cough, congestion, and bodyaches that started 2 days ago.  Patient unsure of fevers.

## 2023-06-24 NOTE — ED Provider Notes (Signed)
[facility]    CSN: [id number] Arrival date & time: 06/24/23  4098      History   Chief Complaint Chief Complaint  Patient presents with   Cough   Generalized Body Aches    HPI Kyle Marshall is a 33 y.o. male.   33 year old male patient, Kyle Marshall, presents to urgent care for evaluation of cough, congestion, body aches that started 2 days prior.  Reports his family has recently been sick with flulike illness and flu.   Patient states he was at his PCP office recently(January) with kids and was handed a prescription for amoxicillin for sore throat, patient took part of it, did not complete it as he was upset that prescriber gave him antibiotics but did not evaluate him.   The history is provided by the patient. No language interpreter was used.    Past Medical History:  Diagnosis Date   Abdominal pain 02/12/2008   Allergic rhinitis 12/08/2014   Asthma    Attention and concentration deficit 05/31/2020   Concussion with loss of consciousness 10/10/2019   Difficulty hearing 02/09/2015   Seizure-like activity (HCC) 10/11/2018   Suspected COVID-19 virus infection 06/17/2019   Witnessed seizure-like activity (HCC) 10/14/2018    Patient Active Problem List   Diagnosis Date Noted   Otitis media, recurrent, right 06/24/2023   Flu-like symptoms 06/24/2023   Acute cough 06/24/2023   Morning headache 06/07/2023   OSA (obstructive sleep apnea) 06/07/2023   Pain of left hip 06/07/2023   Gastroesophageal reflux disease without esophagitis 06/07/2023   Intractable back pain 06/07/2023   Material hardship due to limited financial resources 06/07/2023   Financial insecurity due to medical expenses 12/28/2022   Opioid dependence (HCC) 12/28/2022   High risk medication use 12/28/2022   Compression fracture of L1 lumbar vertebra, sequela 08/07/2022   Sebaceous cyst 07/01/2021   Recurrent nephrolithiasis 07/01/2021   Hyperhidrosis of feet 05/31/2020   H/O renal  calculi 05/31/2020   Essential hypertension 10/10/2019   Substance abuse in remission (HCC) 10/14/2018   Obesity 02/13/2016   Generalized anxiety disorder 06/06/2015   Generalized OA 06/06/2015   History of syncope 06/06/2015   Chronic intractable pain 12/08/2014   Asthma 12/08/2014   Seasonal allergic rhinitis 12/08/2014    Past Surgical History:  Procedure Laterality Date   EAR TUBE REMOVAL     SHOULDER SURGERY Right 2012       Home Medications    Prior to Admission medications   Medication Sig Start Date End Date Taking? Authorizing Provider  amoxicillin-clavulanate (AUGMENTIN) 875-125 MG tablet Take 1 tablet by mouth every 12 (twelve) hours. 06/24/23  Yes Amaia Lavallie, Para March, NP  aluminum chloride (DRYSOL) 20 % external solution Apply topically at bedtime. To feet 06/07/23   Lula Olszewski, MD  amLODipine (NORVASC) 5 MG tablet Take 1 tablet (5 mg total) by mouth daily. 05/22/23   Lula Olszewski, MD  celecoxib (CELEBREX) 200 MG capsule Take 1 capsule (200 mg total) by mouth 2 (two) times daily. 06/07/23   Lula Olszewski, MD  cyclobenzaprine (FLEXERIL) 10 MG tablet Take 1 tablet (10 mg total) by mouth 3 (three) times daily as needed for muscle spasms. 06/07/23   Lula Olszewski, MD  esomeprazole (NEXIUM) 40 MG capsule Take 1 capsule (40 mg total) by mouth daily at 12 noon. Reported on 08/26/2015 06/07/23   Lula Olszewski, MD  fluticasone [facility]) 50 MCG/ACT nasal spray Place 2 sprays into both nostrils daily. 06/07/23  Lula Olszewski, MD  levalbuterol [facility]) 45 MCG/ACT inhaler Inhale 1 puff into the lungs every 6 (six) hours as needed for wheezing. 06/07/23   Lula Olszewski, MD  levocetirizine (XYZAL) 5 MG tablet Take 1 tablet (5 mg total) by mouth every evening. 06/07/23   Lula Olszewski, MD  naloxone [facility]) nasal spray 4 mg/0.1 mL Deliver nasally as directed if poorly responsive or non responsive. 06/07/23   Lula Olszewski, MD    Family History Family History   Problem Relation Age of Onset   Cancer Mother        breast   Atrial fibrillation Mother    Cancer Father        brain, lung     Social History Social History   Tobacco Use   Smoking status: Former   Smokeless tobacco: Never  Vaping Use   Vaping status: Never Used  Substance Use Topics   Alcohol use: Not Currently    Alcohol/week: 0.0 standard drinks of alcohol    Comment: seldom   Drug use: Not Currently    Types: Marijuana     Allergies   Vicodin [hydrocodone-acetaminophen]   Review of Systems Review of Systems  Constitutional:  Positive for fever.  HENT:  Positive for congestion.   Respiratory:  Positive for cough.   Musculoskeletal:  Positive for myalgias.  All other systems reviewed and are negative.    Physical Exam Triage Vital Signs ED Triage Vitals  Encounter Vitals Group     BP 06/24/23 1054 (!) 156/108     Systolic BP Percentile --      Diastolic BP Percentile --      Pulse Rate 06/24/23 1054 (!) 108     Resp 06/24/23 1054 16     Temp 06/24/23 1054 99.6 F (37.6 C)     Temp Source 06/24/23 1054 Oral     SpO2 06/24/23 1054 98 %     Weight 06/24/23 1052 222 lb 10.6 oz (101 kg)     Height 06/24/23 1052 5\' 6"  (1.676 m)     Head Circumference --      Peak Flow --      Pain Score 06/24/23 1052 4     Pain Loc --      Pain Education --      Exclude from Growth Chart --    No data found.  Updated Vital Signs BP (!) 156/108 (BP Location: Left Arm)   Pulse (!) 108   Temp 99.6 F (37.6 C) (Oral)   Resp 16   Ht 5\' 6"  (1.676 m)   Wt 222 lb 10.6 oz (101 kg)   SpO2 98%   BMI 35.94 kg/m   Visual Acuity Right Eye Distance:   Left Eye Distance:   Bilateral Distance:    Right Eye Near:   Left Eye Near:    Bilateral Near:     Physical Exam Vitals and nursing note reviewed.  Constitutional:      General: He is not in acute distress.    Appearance: He is well-developed and well-groomed. He is not ill-appearing or toxic-appearing.  HENT:      Head: Normocephalic.     Right Ear: Tympanic membrane is retracted.     Left Ear: Tympanic membrane is retracted.     Nose: Mucosal edema and congestion present.     Mouth/Throat:     Mouth: Mucous membranes are moist.     Pharynx: Uvula midline.  Eyes:  General: Lids are normal.     Conjunctiva/sclera: Conjunctivae normal.     Pupils: Pupils are equal, round, and reactive to light.  Cardiovascular:     Rate and Rhythm: Regular rhythm. Tachycardia present.     Heart sounds: Normal heart sounds.  Pulmonary:     Effort: Pulmonary effort is normal. No respiratory distress.     Breath sounds: Normal breath sounds and air entry. No decreased breath sounds or wheezing.  Abdominal:     General: There is no distension.     Palpations: Abdomen is soft.  Musculoskeletal:        General: Normal range of motion.     Cervical back: Normal range of motion.  Skin:    General: Skin is warm and dry.     Findings: No rash.  Neurological:     General: No focal deficit present.     Mental Status: He is alert and oriented to person, place, and time.     GCS: GCS eye subscore is 4. GCS verbal subscore is 5. GCS motor subscore is 6.     Cranial Nerves: No cranial nerve deficit.     Sensory: No sensory deficit.  Psychiatric:        Speech: Speech normal.        Behavior: Behavior normal. Behavior is cooperative.      UC Treatments / Results  Labs (all labs ordered are listed, but only abnormal results are displayed) Labs Reviewed  RESP PANEL BY RT-PCR (FLU A&B, COVID) ARPGX2    EKG   Radiology No results found.  Procedures Procedures (including critical care time)  Medications Ordered in UC Medications - No data to display  Initial Impression / Assessment and Plan / UC Course  I have reviewed the triage vital signs and the nursing notes.  Pertinent labs & imaging results that were available during my care of the patient were reviewed by me and considered in my medical  decision making (see chart for details).    Discussed exam findings and plan of care with patient, strict go to ER precautions given.   Patient verbalized understanding to this provider.  Ddx: Right otitis media, flulike symptoms, acute cough Final Clinical Impressions(s) / UC Diagnoses   Final diagnoses:  Otitis media, recurrent, right  Flu-like symptoms  Acute cough     Discharge Instructions      Your flu and COVID test are negative, we are treating you with Augmentin for right ear infection, please take antibiotic in its entirety do not skip or save or share any.  Drink plenty of fluids.  May take Tylenol for any body aches or fever as label directed.  May take Chloraseptic throat lozenges, Mucinex, Robitussin, as label directed for symptom management.  Return as needed     ED Prescriptions     Medication Sig Dispense Auth. Provider   amoxicillin-clavulanate (AUGMENTIN) 875-125 MG tablet Take 1 tablet by mouth every 12 (twelve) hours. 14 tablet Clydie Dillen, Para March, NP      PDMP not reviewed this encounter.   Clancy Gourd, NP 06/24/23 1208

## 2023-06-24 NOTE — Discharge Instructions (Signed)
Your flu and COVID test are negative, we are treating you with Augmentin for right ear infection, please take antibiotic in its entirety do not skip or save or share any.  Drink plenty of fluids.  May take Tylenol for any body aches or fever as label directed.  May take Chloraseptic throat lozenges, Mucinex, Robitussin, as label directed for symptom management.  Return as needed

## 2023-07-01 ENCOUNTER — Other Ambulatory Visit: Payer: Self-pay | Admitting: Internal Medicine

## 2023-07-01 DIAGNOSIS — J301 Allergic rhinitis due to pollen: Secondary | ICD-10-CM

## 2023-08-21 ENCOUNTER — Other Ambulatory Visit: Payer: Self-pay | Admitting: Internal Medicine

## 2023-08-21 ENCOUNTER — Telehealth: Payer: Self-pay | Admitting: Internal Medicine

## 2023-08-21 DIAGNOSIS — I10 Essential (primary) hypertension: Secondary | ICD-10-CM

## 2023-08-21 DIAGNOSIS — M62838 Other muscle spasm: Secondary | ICD-10-CM

## 2023-08-21 NOTE — Telephone Encounter (Unsigned)
 Copied from CRM [id number]. Topic: Referral - Status >> Aug 21, 2023 12:35 PM Sim Boast F wrote: Reason for CRM: Patient called to follow up on Pain Management, Sleep [facility] and Neurology referral, says he never received a call but his number did change so I updated it. Please call him at [phone]

## 2023-08-21 NOTE — Telephone Encounter (Signed)
 Copied from CRM [id number]. Topic: Clinical - Medication Refill >> Aug 21, 2023 12:29 PM Sim Boast F wrote: Most Recent Primary Care Visit:  Provider: Lula Olszewski  Department: [facility]  Visit Type: OFFICE VISIT  Date: 06/07/2023  Medication: amLODipine cyclobenzaprine   Has the patient contacted their pharmacy? Yes (Agent: If no, request that the patient contact the pharmacy for the refill. If patient does not wish to contact the pharmacy document the reason why and proceed with request.) (Agent: If yes, when and what did the pharmacy advise?)  Is this the correct pharmacy for this prescription? Yes If no, delete pharmacy and type the correct one.  This is the patient's preferred pharmacy:   [id number] - [place], [place] - [address] [place] [address] Phone: [phone] Fax: [phone]   Has the prescription been filled recently? Yes  Is the patient out of the medication? Yes, has last pill today   Has the patient been seen for an appointment in the last year OR does the patient have an upcoming appointment? Yes  Can we respond through MyChart? No  Agent: Please be advised that Rx refills may take up to 3 business days. We ask that you follow-up with your pharmacy.

## 2023-08-22 ENCOUNTER — Other Ambulatory Visit: Payer: Self-pay | Admitting: Internal Medicine

## 2023-08-22 DIAGNOSIS — K219 Gastro-esophageal reflux disease without esophagitis: Secondary | ICD-10-CM

## 2023-08-22 MED ORDER — AMLODIPINE BESYLATE 5 MG PO TABS
5.0000 mg | ORAL_TABLET | Freq: Every day | ORAL | 2 refills | Status: DC
Start: 2023-08-22 — End: 2024-02-19

## 2023-08-22 MED ORDER — CYCLOBENZAPRINE HCL 10 MG PO TABS: 10.0000 mg | ORAL_TABLET | Freq: Three times a day (TID) | ORAL | 1 refills | Status: AC | PRN

## 2023-08-23 NOTE — Telephone Encounter (Signed)
 All requested  referrals were refaxed to providers with patient's new phone number. MyChart messages sent to patient as well.

## 2023-08-24 ENCOUNTER — Telehealth: Payer: Self-pay | Admitting: *Deleted

## 2023-08-24 NOTE — Progress Notes (Signed)
 Complex Care Management Note  Care Guide Note 08/24/2023 Name: Kyle Marshall MRN: [id number] DOB: 08-16-1990  Kyle Marshall is a 33 y.o. year old male who sees Lula Olszewski, MD for primary care. I reached out to Kyle Marshall by phone today to offer complex care management services.  [name] was given information about Complex Care Management services today including:   The Complex Care Management services include support from the care team which includes your Nurse Care Manager, Clinical Social Worker, or Pharmacist.  The Complex Care Management team is here to help remove barriers to the health concerns and goals most important to you. Complex Care Management services are voluntary, and the patient may decline or stop services at any time by request to their care team member.   Complex Care Management Consent Status: Patient agreed to services and verbal consent obtained.   Follow up plan:  Telephone appointment with complex care management team member scheduled for:  08/29/2023  Encounter Outcome:  Patient Scheduled  Burman Nieves, CMA [place]  [facility], [facility] Guide Direct Dial: [phone]  Fax: [phone] Website: [email or web address]

## 2023-08-29 ENCOUNTER — Encounter

## 2023-08-30 ENCOUNTER — Other Ambulatory Visit: Payer: Self-pay
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
## 2023-08-30 NOTE — Patient Outreach (Signed)
 Complex Care Management   Visit Note  08/30/2023  Name:  GLENNON KOPKO MRN: [id number] DOB: 1991-03-27  Situation: Referral received for Complex Care Management related to SDOH Barriers:  Food insecurity Financial Resource Strain I obtained verbal consent from patient.  Visit completed with patient  on the phone  Background:   Past Medical History:  Diagnosis Date   Abdominal pain 02/12/2008   Allergic rhinitis 12/08/2014   Asthma    Attention and concentration deficit 05/31/2020   Concussion with loss of consciousness 10/10/2019   Difficulty hearing 02/09/2015   Seizure-like activity (HCC) 10/11/2018   Suspected COVID-19 virus infection 06/17/2019   Witnessed seizure-like activity (HCC) 10/14/2018    Assessment: Patient Reported Symptoms:  Cognitive    Neurological      HEENT      Cardiovascular      Respiratory      Endocrine      Gastrointestinal      Genitourinary      Integumentary      Musculoskeletal      Psychosocial       There were no vitals filed for this visit.  Medications Reviewed Today   Medications were not reviewed in this encounter     Recommendation:   Referral to: DSS-Work First and food banks  Follow Up Plan:   Telephone follow-up in 1 week  Lysle Morales, BSW [place]  [facility], [facility] Social Worker Direct Dial: [phone]  Fax: [phone] Website: [email or web address]

## 2023-09-05 ENCOUNTER — Other Ambulatory Visit: Payer: Self-pay | Admitting: Internal Medicine

## 2023-09-05 ENCOUNTER — Ambulatory Visit: Payer: Medicaid Other | Admitting: Internal Medicine

## 2023-09-05 ENCOUNTER — Encounter: Payer: Self-pay | Admitting: Internal Medicine

## 2023-09-05 VITALS — BP 138/88 | HR 118 | Temp 99.9°F | Ht 66.0 in | Wt 209.4 lb

## 2023-09-05 DIAGNOSIS — M549 Dorsalgia, unspecified: Secondary | ICD-10-CM

## 2023-09-05 DIAGNOSIS — I10 Essential (primary) hypertension: Secondary | ICD-10-CM

## 2023-09-05 DIAGNOSIS — G8929 Other chronic pain: Secondary | ICD-10-CM

## 2023-09-05 DIAGNOSIS — Z59819 Housing instability, housed unspecified: Secondary | ICD-10-CM

## 2023-09-05 DIAGNOSIS — M25552 Pain in left hip: Secondary | ICD-10-CM

## 2023-09-05 DIAGNOSIS — M159 Polyosteoarthritis, unspecified: Secondary | ICD-10-CM

## 2023-09-05 DIAGNOSIS — Z5986 Financial insecurity: Secondary | ICD-10-CM

## 2023-09-05 DIAGNOSIS — N200 Calculus of kidney: Secondary | ICD-10-CM | POA: Diagnosis not present

## 2023-09-05 DIAGNOSIS — M545 Low back pain, unspecified: Secondary | ICD-10-CM

## 2023-09-05 DIAGNOSIS — Z5987 Material hardship due to limited financial resources, not elsewhere classified: Secondary | ICD-10-CM

## 2023-09-05 DIAGNOSIS — G629 Polyneuropathy, unspecified: Secondary | ICD-10-CM

## 2023-09-05 DIAGNOSIS — R0683 Snoring: Secondary | ICD-10-CM

## 2023-09-05 MED ORDER — LIDOCAINE 10 % EX CREA: 1.0000 | TOPICAL_CREAM | Freq: Every day | CUTANEOUS | 11 refills | Status: DC
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
## 2023-09-06 ENCOUNTER — Other Ambulatory Visit: Payer: Self-pay
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
## 2023-09-07 NOTE — Assessment & Plan Note (Signed)
 Its difficult for him to afford the 4$ copays and he is struggling to obtain disability benefits

## 2023-09-07 NOTE — Patient Instructions (Signed)
 VISIT SUMMARY:  You came in today for a follow-up on your chronic back pain and therapy progress. We discussed your ongoing pain, therapy, and other health concerns, including arthritis, neuropathy, sleep apnea, kidney stones, and hypertension. We also talked about your stress due to your mother's health and potential housing issues.  YOUR PLAN:  -CHRONIC BACK PAIN: Chronic back pain is long-lasting pain in your back that can be caused by various factors, including past injuries. We recommend continuing physical therapy and starting aqua therapy. An MRI of your lumbar spine will be done to understand the cause of your pain better and support your disability claim. You will also be referred to an orthopedic surgeon for your knees and hips. Lidocaine cream is prescribed for localized pain relief, and a referral to a pain clinic will be reordered.  -ARTHRITIS: Arthritis is inflammation of the joints, which can cause pain and stiffness. Your arthritis is likely worsened by past injuries and weight gain. We recommend continuing your weight management efforts and referring you to an orthopedic surgeon to document your current disability and manage your condition.  -NEUROPATHY: Neuropathy is nerve damage that can cause pain, numbness, or tingling. Your symptoms seem to be related to using a foot shim for your hip displacement. We advise consulting with a podiatrist to assess your foot pressure points and ensure proper orthotic fitting.  -SLEEP APNEA: Sleep apnea is a condition where your breathing stops and starts during sleep. We will reorder a referral for a sleep study to diagnose and manage this condition.  -KIDNEY STONES: Kidney stones are hard deposits made of minerals and salts that form in your kidneys. You have a history of kidney stones and occasional blood in your urine and semen. We recommend blood and urine tests to assess your kidney function and provide dietary guidance to prevent future  stones, emphasizing increased water intake.  -HYPERTENSION: Hypertension is high blood pressure, which can be influenced by pain levels. We will continue to monitor your blood pressure and adjust your medication as needed.  -GENERAL HEALTH MAINTENANCE: We understand you are experiencing financial difficulties impacting your access to healthcare. We will assist you in obtaining a handicap parking placard due to your orthopedic condition. Please ensure your contact information is updated for communication regarding appointments and referrals.  INSTRUCTIONS:  Schedule a follow-up appointment in three months. Consider virtual follow-up appointments to accommodate travel difficulties.

## 2023-09-07 NOTE — Assessment & Plan Note (Signed)
 Arthritis is present throughout the body, likely exacerbated by previous injuries and weight gain. Recent weight loss may alleviate joint stress. Orthopedic evaluation is recommended to document the degree of current disability and support his disability claim. Continue weight management efforts. Refer to an orthopedic surgeon for further evaluation and management.

## 2023-09-07 NOTE — Assessment & Plan Note (Signed)
 Blood pressure is borderline, possibly influenced by pain levels. He is on antihypertensive medication. Monitor blood pressure and adjust medication as needed.

## 2023-09-12 ENCOUNTER — Ambulatory Visit
Admission: RE | Admit: 2023-09-12 | Discharge: 2023-09-12 | Disposition: A | Discharge status: Home / Self Care | Source: Ambulatory Visit | Attending: Internal Medicine | Admitting: Internal Medicine

## 2023-09-12 DIAGNOSIS — M545 Low back pain, unspecified: Secondary | ICD-10-CM

## 2023-09-16 ENCOUNTER — Encounter: Payer: Self-pay | Admitting: Internal Medicine

## 2023-09-19 ENCOUNTER — Other Ambulatory Visit: Payer: Self-pay

## 2023-09-19 NOTE — Patient Outreach (Signed)
 Complex Care Management   Visit Note  09/19/2023  Name:  Kyle Marshall MRN: [id number] DOB: 1990/05/30  Situation: Referral received for Complex Care Management related to SDOH Barriers:  Financial Resource Strain I obtained verbal consent from Patient.  Visit completed with patient  on the phone  Background:   Past Medical History:  Diagnosis Date   Abdominal pain 02/12/2008   Allergic rhinitis 12/08/2014   Asthma    Attention and concentration deficit 05/31/2020   Concussion with loss of consciousness 10/10/2019   Difficulty hearing 02/09/2015   Seizure-like activity (HCC) 10/11/2018   Suspected COVID-19 virus infection 06/17/2019   Witnessed seizure-like activity (HCC) 10/14/2018    Assessment:  Patient reports he spoke to DSS [employer] staff regarding application for [employer].  Patient is expecting a call back within the next day.  Benefits will be provided for 3 months.  Patient does express being overwhelmed due to medical issues within his immediate family.  Patient will follow up with DSS if he does not receive a return call tomorrow.   Recommendation:   No provider recommendations.  Follow Up Plan:   Telephone follow up appointment date/time:  09/22/23 at 11am  Dallis Dues, BSW [place]  [facility], [facility] Social Worker Direct Dial: [phone]  Fax: [phone] Website: [email or web address]

## 2023-09-19 NOTE — Patient Instructions (Signed)
 Visit Information  Thank you for taking time to visit with me today. Please don't hesitate to contact me if I can be of assistance to you before our next scheduled appointment.  Your next care management appointment is by telephone on 09/22/23 at 11am   Please call the care guide team at [phone] if you need to cancel, schedule, or reschedule an appointment.   Please call 911 if you are experiencing a Mental Health or Behavioral Health Crisis or need someone to talk to.  Dallis Dues, BSW [place]  [facility], [facility] Social Worker Direct Dial: [phone]  Fax: [phone] Website: [email or web address]

## 2023-09-22 ENCOUNTER — Other Ambulatory Visit: Payer: Self-pay

## 2023-09-22 NOTE — Patient Instructions (Signed)
 Visit Information  Thank you for taking time to visit with me today. Please don't hesitate to contact me if I can be of assistance to you before our next scheduled appointment.  Your next care management appointment is by telephone on 10/09/23 at 11am  Please call the care guide team at [phone] if you need to cancel, schedule, or reschedule an appointment.   Please call 911 if you are experiencing a Mental Health or Behavioral Health Crisis or need someone to talk to.  Dallis Dues, BSW [place]  [facility], [facility] Social Worker Direct Dial: [phone]  Fax: [phone] Website: [email or web address]

## 2023-09-22 NOTE — Patient Outreach (Signed)
 Complex Care Management   Visit Note  09/22/2023  Name:  Kyle Marshall MRN: [id number] DOB: 04-Sep-1990  Situation: Referral received for Complex Care Management related to SDOH Barriers:  Financial Resource Strain I obtained verbal consent from Patient.  Visit completed with patient  on the phone  Background:   Past Medical History:  Diagnosis Date   Abdominal pain 02/12/2008   Allergic rhinitis 12/08/2014   Asthma    Attention and concentration deficit 05/31/2020   Concussion with loss of consciousness 10/10/2019   Difficulty hearing 02/09/2015   Seizure-like activity (HCC) 10/11/2018   Suspected COVID-19 virus infection 06/17/2019   Witnessed seizure-like activity (HCC) 10/14/2018    Assessment: T/c patient and he reports that DSS staff did not complete his application fully for Benefit Diversion.  DSS manager assisted with setting up his account and will mail additional forms to complete.  It is not clear that he is approved for the funding and will have to wait longer to determine eligibility.  Patient continues to be assisted by his mother, but she is having medical challenges.   Recommendation:   No provider recommendations  Follow Up Plan:   Telephone follow up appointment date/time:  10/09/23 11am  [facility]  [facility], [facility] Social Worker Direct Dial: [phone]  Fax: [phone] Website: [email or web address]

## 2023-09-26 ENCOUNTER — Ambulatory Visit: Admitting: Orthopaedic Surgery

## 2023-10-05 ENCOUNTER — Institutional Professional Consult (permissible substitution): Admitting: Neurology

## 2023-10-09 ENCOUNTER — Other Ambulatory Visit: Payer: Self-pay

## 2023-10-09 NOTE — Patient Outreach (Signed)
 Complex Care Management   Visit Note  10/09/2023  Name:  Kyle Marshall MRN: [id number] DOB: September 20, 1990  Situation: Referral received for Complex Care Management related to SDOH Barriers:  Financial Resource Strain I obtained verbal consent from Patient.  Visit completed with patient  on the phone  Background:   Past Medical History:  Diagnosis Date   Abdominal pain 02/12/2008   Allergic rhinitis 12/08/2014   Asthma    Attention and concentration deficit 05/31/2020   Concussion with loss of consciousness 10/10/2019   Difficulty hearing 02/09/2015   Seizure-like activity (HCC) 10/11/2018   Suspected COVID-19 virus infection 06/17/2019   Witnessed seizure-like activity (HCC) 10/14/2018    Assessment:  Patient reports he spoke with someone from DSS and there is some confusion about the Benefit Diversion program.  Staff reports he has to be able to look for work, otherwise he can't participate in the program.  Patient is upset because this information was not provided by any other staff member with DSS.  Patient will await an update on his disability.  Recently had death in the family.  SW will follow up in a month on disability.   Recommendation:   No provider recommendations.  Follow Up Plan:   Telephone follow up appointment date/time:  11/09/23 at 11am  Dallis Dues, BSW [place]  [facility], [facility] Social Worker Direct Dial: [phone]  Fax: [phone] Website: [email or web address]

## 2023-10-09 NOTE — Patient Instructions (Signed)
 Visit Information  Thank you for taking time to visit with me today. Please don't hesitate to contact me if I can be of assistance to you before our next scheduled appointment.  Your next care management appointment is by telephone on 10/23/23 at 9am   Please call the care guide tam at [phone] if you need to cancel, schedule, or reschedule an appointment.   Please call 911 if you are experiencing a Mental Health or Behavioral Health Crisis or need someone to talk to.  Dallis Dues, BSW [place]  [facility], [facility] Social Worker Direct Dial: [phone]  Fax: [phone] Website: [email or web address]

## 2023-10-10 ENCOUNTER — Ambulatory Visit: Admitting: Orthopaedic Surgery

## 2023-10-19 ENCOUNTER — Telehealth (INDEPENDENT_AMBULATORY_CARE_PROVIDER_SITE_OTHER): Admitting: Family

## 2023-10-19 DIAGNOSIS — G8929 Other chronic pain: Secondary | ICD-10-CM

## 2023-10-19 DIAGNOSIS — M546 Pain in thoracic spine: Secondary | ICD-10-CM | POA: Diagnosis not present
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
## 2023-10-24 ENCOUNTER — Ambulatory Visit: Admitting: Orthopaedic Surgery

## 2023-10-24 DIAGNOSIS — M25552 Pain in left hip: Secondary | ICD-10-CM
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
## 2023-11-08 ENCOUNTER — Institutional Professional Consult (permissible substitution): Admitting: Neurology

## 2023-11-08 HISTORY — PX: OTHER SURGICAL HISTORY: SHX169

## 2023-11-09 ENCOUNTER — Other Ambulatory Visit: Payer: Self-pay
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
## 2023-11-09 NOTE — Patient Instructions (Signed)
 Visit Information  Thank you for taking time to visit with me today. Please don't hesitate to contact me if I can be of assistance to you before our next scheduled appointment.  Your next care management appointment is by telephone on 02/14/24 at 1pm    Please call the care guide team at [phone] if you need to cancel, schedule, or reschedule an appointment.   Please call 911 if you are experiencing a Mental Health or Behavioral Health Crisis or need someone to talk to.  Dallis Dues, BSW   [facility], [facility] Social Worker Direct Dial: [phone]  Fax: [phone] Website: [email or web address]

## 2023-11-10 ENCOUNTER — Ambulatory Visit: Admitting: Neurology

## 2023-11-10 ENCOUNTER — Encounter: Payer: Self-pay | Admitting: Neurology

## 2023-11-10 VITALS — BP 132/88 | HR 101 | Ht 65.0 in | Wt 218.0 lb

## 2023-11-10 DIAGNOSIS — Z9189 Other specified personal risk factors, not elsewhere classified: Secondary | ICD-10-CM | POA: Diagnosis not present

## 2023-11-10 DIAGNOSIS — G4719 Other hypersomnia: Secondary | ICD-10-CM

## 2023-11-10 DIAGNOSIS — R030 Elevated blood-pressure reading, without diagnosis of hypertension: Secondary | ICD-10-CM | POA: Diagnosis not present

## 2023-11-10 DIAGNOSIS — R351 Nocturia: Secondary | ICD-10-CM

## 2023-11-10 DIAGNOSIS — R519 Headache, unspecified: Secondary | ICD-10-CM

## 2023-11-10 DIAGNOSIS — R0683 Snoring: Secondary | ICD-10-CM | POA: Diagnosis not present

## 2023-11-10 DIAGNOSIS — R0681 Apnea, not elsewhere classified: Secondary | ICD-10-CM

## 2023-11-10 DIAGNOSIS — Z820 Family history of epilepsy and other diseases of the nervous system: Secondary | ICD-10-CM

## 2023-11-10 DIAGNOSIS — E669 Obesity, unspecified: Secondary | ICD-10-CM

## 2023-11-10 NOTE — Patient Instructions (Signed)
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
## 2023-11-14 ENCOUNTER — Telehealth: Payer: Self-pay | Admitting: Neurology

## 2023-11-14 NOTE — Telephone Encounter (Signed)
 HST [facility] pending

## 2023-11-20 NOTE — Telephone Encounter (Signed)
 HST [facility] no auth req via fax

## 2023-11-30 ENCOUNTER — Other Ambulatory Visit: Payer: Self-pay | Admitting: Internal Medicine

## 2023-11-30 DIAGNOSIS — K219 Gastro-esophageal reflux disease without esophagitis: Secondary | ICD-10-CM

## 2023-12-01 ENCOUNTER — Ambulatory Visit (INDEPENDENT_AMBULATORY_CARE_PROVIDER_SITE_OTHER): Admitting: Neurology

## 2023-12-01 DIAGNOSIS — E669 Obesity, unspecified: Secondary | ICD-10-CM

## 2023-12-01 DIAGNOSIS — Z820 Family history of epilepsy and other diseases of the nervous system: Secondary | ICD-10-CM

## 2023-12-01 DIAGNOSIS — G4719 Other hypersomnia: Secondary | ICD-10-CM

## 2023-12-01 DIAGNOSIS — R351 Nocturia: Secondary | ICD-10-CM

## 2023-12-01 DIAGNOSIS — R0681 Apnea, not elsewhere classified: Secondary | ICD-10-CM

## 2023-12-01 DIAGNOSIS — R0683 Snoring: Secondary | ICD-10-CM

## 2023-12-01 DIAGNOSIS — Z9189 Other specified personal risk factors, not elsewhere classified: Secondary | ICD-10-CM

## 2023-12-01 DIAGNOSIS — G4710 Hypersomnia, unspecified: Secondary | ICD-10-CM

## 2023-12-01 DIAGNOSIS — R030 Elevated blood-pressure reading, without diagnosis of hypertension: Secondary | ICD-10-CM

## 2023-12-01 DIAGNOSIS — R519 Headache, unspecified: Secondary | ICD-10-CM

## 2023-12-05 ENCOUNTER — Telehealth: Payer: Self-pay

## 2023-12-05 ENCOUNTER — Other Ambulatory Visit (HOSPITAL_COMMUNITY): Payer: Self-pay

## 2023-12-05 ENCOUNTER — Encounter: Payer: Self-pay | Admitting: Internal Medicine

## 2023-12-05 ENCOUNTER — Telehealth: Admitting: Internal Medicine

## 2023-12-05 DIAGNOSIS — M159 Polyosteoarthritis, unspecified: Secondary | ICD-10-CM

## 2023-12-05 DIAGNOSIS — K219 Gastro-esophageal reflux disease without esophagitis: Secondary | ICD-10-CM

## 2023-12-05 DIAGNOSIS — E669 Obesity, unspecified: Secondary | ICD-10-CM | POA: Diagnosis not present

## 2023-12-05 DIAGNOSIS — J4520 Mild intermittent asthma, uncomplicated: Secondary | ICD-10-CM

## 2023-12-05 DIAGNOSIS — G8929 Other chronic pain: Secondary | ICD-10-CM

## 2023-12-05 DIAGNOSIS — G629 Polyneuropathy, unspecified: Secondary | ICD-10-CM

## 2023-12-05 DIAGNOSIS — G4733 Obstructive sleep apnea (adult) (pediatric): Secondary | ICD-10-CM | POA: Diagnosis not present

## 2023-12-05 MED ORDER — SEMAGLUTIDE-WEIGHT MANAGEMENT 2.4 MG/0.75ML ~~LOC~~ SOAJ: 2.4000 mg | SUBCUTANEOUS | 0 refills | Status: AC

## 2023-12-05 MED ORDER — CELECOXIB 200 MG PO CAPS: 200.0000 mg | ORAL_CAPSULE | Freq: Two times a day (BID) | ORAL | 2 refills | Status: AC

## 2023-12-05 MED ORDER — SEMAGLUTIDE-WEIGHT MANAGEMENT 1 MG/0.5ML ~~LOC~~ SOAJ
1.0000 mg | SUBCUTANEOUS | 0 refills | Status: AC
Start: 2024-02-01 — End: 2024-02-29

## 2023-12-05 MED ORDER — SEMAGLUTIDE-WEIGHT MANAGEMENT 1.7 MG/0.75ML ~~LOC~~ SOAJ: 1.7000 mg | SUBCUTANEOUS | 0 refills | Status: AC

## 2023-12-05 MED ORDER — SEMAGLUTIDE-WEIGHT MANAGEMENT 0.25 MG/0.5ML ~~LOC~~ SOAJ
0.2500 mg | SUBCUTANEOUS | 0 refills | Status: AC
Start: 2023-12-05 — End: 2024-01-02

## 2023-12-05 MED ORDER — SEMAGLUTIDE-WEIGHT MANAGEMENT 0.5 MG/0.5ML ~~LOC~~ SOAJ: 0.5000 mg | SUBCUTANEOUS | 0 refills | Status: AC

## 2023-12-05 MED ORDER — ESOMEPRAZOLE MAGNESIUM 20 MG PO PACK
20.0000 mg | PACK | Freq: Every day | ORAL | 12 refills | Status: AC
Start: 2023-12-05 — End: ?

## 2023-12-05 NOTE — Assessment & Plan Note (Signed)
 Currently on Esomeprazole  (Nexium ) 40 mg daily. Plan to reduce dose to 20 mg to eventually wean off due to potential long-term risks such as kidney disease and dementia. Alternate daily dosing to gradually reduce dependency.

## 2023-12-05 NOTE — Progress Notes (Signed)
 ====================================  [facility]: [phone]   --  Virtual Video Medical Office Visit --  Patient: Kyle Marshall      Age: 33 y.o.       Sex:  male  Date:   12/05/2023 Today's Healthcare Provider: Bernardino KANDICE Cone, MD  ====================================    Chief Complaint/Reason For Visit: Chief Complaint  Patient presents with   Back Pain     Discussed the use of AI scribe software for clinical note transcription with the patient, who gave verbal consent to proceed.  History of Present Illness 33 year old male with chronic back pain who presents for follow-up.  He has been experiencing ongoing back pain and recently underwent trigger point injections about a month ago, which provided some relief. He is scheduled for a potential steroid injection into the joint at the end of the month. He is currently taking Celebrex , which helps with headaches and shoulder pain but is not significantly effective for his back pain. He has been attending physical therapy at [facility], which he feels is improving his mobility, although he still experiences stiffness and difficulty with certain movements, such as getting in and out of a car or bed.  He recently had a sleep study and is awaiting results. He has been using a CPAP machine, which he returned after the study.  Regarding his asthma, he reports no significant issues and is not currently using an inhaler. He experiences some coughing and breathlessness when lying on his back but does not feel out of breath during normal activities.  He is also dealing with nerve pain in his leg, which has calmed down somewhat. He has not been able to see [facility].  He is awaiting a decision on his disability claim, which may require him to appear before a judge.   Medications reviewed Current Outpatient Medications on File Prior to Visit  Medication Sig   aluminum  chloride (DRYSOL) 20 % external  solution Apply topically at bedtime. To feet   amLODipine  (NORVASC ) 5 MG tablet Take 1 tablet (5 mg total) by mouth daily.   cyclobenzaprine  (FLEXERIL ) 10 MG tablet Take 1 tablet (10 mg total) by mouth 3 (three) times daily as needed for muscle spasms.   fluticasone  (FLONASE ) 50 MCG/ACT nasal spray SPRAY 2 SPRAYS INTO EACH NOSTRIL EVERY DAY   gabapentin (NEURONTIN) 300 MG capsule Take 300 mg by mouth at bedtime.   levalbuterol  (XOPENEX  HFA) 45 MCG/ACT inhaler Inhale 1 puff into the lungs every 6 (six) hours as needed for wheezing.   levocetirizine (XYZAL ) 5 MG tablet Take 1 tablet (5 mg total) by mouth every evening.   naloxone  (NARCAN ) nasal spray 4 mg/0.1 mL Deliver nasally as directed if poorly responsive or non responsive.   Current Facility-Administered Medications on File Prior to Visit  Medication   bupivacaine  (MARCAINE ) 0.5 % injection 10 mL   Medications Discontinued During This Encounter  Medication Reason   esomeprazole  (NEXIUM ) 40 MG capsule Completed Course   celecoxib  (CELEBREX ) 200 MG capsule Reorder        Virtual Physical Exam Physical Exam  Exam Context: Evaluation limited by virtual format; however, patient is clearly visualized, cooperative, and engaged throughout. He is in a car.  He has well groomed beard. General Appearance: Well-developed, well-nourished; no acute distress by limited video assessment. Pulmonary: No respiratory distress apparent; normal work of breathing observed. Neurological: Patient is awake, alert, and demonstrates no obvious focal neurological deficits or cognitive impairments; sensorium  appears unclouded. Psychiatric/Mental Status: Mood is appropriate; demeanor is pleasant, calm, and articulate. Speech is coherent and goal-directed with no evidence of slurred or pressured speech. No abnormal psychomotor activity noted. Substance Misuse Indicators: Pupils appear symmetric and reactive as far as can be assessed via video. No track marks, skin  lesions, or other stigmata of substance misuse visible. No signs of intoxication or withdrawal are evident.         No results found for any visits on 12/05/23. Admission on 06/24/2023, Discharged on 06/24/2023  Component Date Value   SARS Coronavirus 2 by RT* 06/24/2023 NEGATIVE    Influenza A by PCR 06/24/2023 NEGATIVE    Influenza B by PCR 06/24/2023 NEGATIVE   Office Visit on 12/28/2022  Component Date Value   Buprenorphine  12/28/2022 Negative    Ethyl Glucuronide 12/28/2022 Negative    Amphetamines 12/28/2022 Negative    Barbiturates 12/28/2022 Negative    Benzodiazepines 12/28/2022 Negative    Cocaine Metabolite 12/28/2022 ++POSITIVE++ (A)    Phencyclidine (PCP) 12/28/2022 Negative    Marijuana MTB (THC) 12/28/2022 Negative    6-Acetylmorphine 12/28/2022 Negative    Opiates 12/28/2022 Negative    Oxycodone  12/28/2022 Negative    Tapentadol 12/28/2022 Negative    Fentanyl 12/28/2022 Negative    Methadone 12/28/2022 Negative    Propoxyphene 12/28/2022 Negative    Tramadol  12/28/2022 Negative    Carisoprodol 12/28/2022 Negative    Gabapentin 12/28/2022 Negative    Creatinine 12/28/2022 70    Urine pH 12/28/2022 8.0    Nitrites 12/28/2022 Negative    Cocaine 12/28/2022 Not Detected    Benzoylecgonine 12/28/2022 269    Cocaethylene 12/28/2022 Not Detected   No image results found. MR Lumbar Spine Wo Contrast Result Date: 09/16/2023 MR LUMBAR SPINE WITHOUT IV CONTRAST COMPARISON: None available CLINICAL HISTORY: Lumbar plexopathy TECHNIQUE: SAG T2, SAG T1, SAG STIR, AX T2, AX T1 without IV contrast. FINDINGS: There is mild kyphosis at the thoracic lumbar junction with chronic superior endplate compression deformities most notably at T12 and L1. Mild superior endplate Schmorl's nodes are identified from T11 through L1. No acute fracture. There is less than 25% height loss. No significant retropulsion of fragments or involvement of the posterior elements. There is no acute  vertebral body height loss, subluxation or marrow replacing process. The sacrum and SI joints are unremarkable so far as visualized. Conus and cauda equina are unremarkable. T12-L1: Mild disc desiccation without foraminal or spinal stenosis. L1-2: There is no focal disc protrusion, foraminal or spinal stenosis. L2-3: There is no focal disc protrusion, foraminal or spinal stenosis. L3-4: There is no focal disc protrusion, foraminal or spinal stenosis. L4-5: There is no focal disc protrusion, foraminal or spinal stenosis. Mild facet arthrosis. L5-S1: Minimal broad-based disc of C5 facet arthrosis. No significant foraminal or spinal stenosis. The retroperitoneal structures demonstrate no significant abnormality. IMPRESSION: Chronic superior endplate compression deformities at the thoracolumbar junction resulting in mild kyphosis. There is no significant height loss. Small Schmorl's nodes are identified at thoracolumbar junction as above. There is no significant foraminal or spinal stenosis. No acute abnormality. No abnormality explaining patient's history of lumbar plexopathy. Electronically signed by: Norleen Satchel MD 09/16/2023 11:05 AM EDT RP Workstation: [id number]  MR Lumbar Spine Wo Contrast Result Date: 09/16/2023 MR LUMBAR SPINE WITHOUT IV CONTRAST COMPARISON: None available CLINICAL HISTORY: Lumbar plexopathy TECHNIQUE: SAG T2, SAG T1, SAG STIR, AX T2, AX T1 without IV contrast. FINDINGS: There is mild kyphosis at the thoracic lumbar junction with chronic superior endplate compression deformities  most notably at T12 and L1. Mild superior endplate Schmorl's nodes are identified from T11 through L1. No acute fracture. There is less than 25% height loss. No significant retropulsion of fragments or involvement of the posterior elements. There is no acute vertebral body height loss, subluxation or marrow replacing process. The sacrum and SI joints are unremarkable so far as visualized. Conus and cauda equina are  unremarkable. T12-L1: Mild disc desiccation without foraminal or spinal stenosis. L1-2: There is no focal disc protrusion, foraminal or spinal stenosis. L2-3: There is no focal disc protrusion, foraminal or spinal stenosis. L3-4: There is no focal disc protrusion, foraminal or spinal stenosis. L4-5: There is no focal disc protrusion, foraminal or spinal stenosis. Mild facet arthrosis. L5-S1: Minimal broad-based disc of C5 facet arthrosis. No significant foraminal or spinal stenosis. The retroperitoneal structures demonstrate no significant abnormality. IMPRESSION: Chronic superior endplate compression deformities at the thoracolumbar junction resulting in mild kyphosis. There is no significant height loss. Small Schmorl's nodes are identified at thoracolumbar junction as above. There is no significant foraminal or spinal stenosis. No acute abnormality. No abnormality explaining patient's history of lumbar plexopathy. Electronically signed by: Norleen Satchel MD 09/16/2023 11:05 AM EDT RP Workstation: [id number]       Assessment & Plan OSA (obstructive sleep apnea) Diagnosed with obstructive sleep apnea, awaiting sleep study results. Use a CPAP machine, preferably ResMed. Weight loss is suggested to help manage sleep apnea, with Wegovy  recommended for weight loss, potentially improving back pain and breathing. Mild intermittent asthma without complication No significant issues with asthma currently, not using inhaler regularly. Experiences coughing and breathlessness when lying on his back but does not require frequent inhaler use. Obesity with serious comorbidity, unspecified class, unspecified obesity type Experiencing weight issues contributing to back pain and sleep apnea. Start Wegovy  for weight loss, potentially aiding sleep apnea and overall health. Monitor for potential side effects such as nausea. Encourage resistance training and aqua therapy. Gastroesophageal reflux disease, unspecified whether  esophagitis present Schedule a follow-up appointment in one month to assess the effectiveness of the new medication regimen and overall progress. Ensure all new medications are picked up from the pharmacy. Chronic intractable pain Chronic intractable back pain is managed with trigger point injections, providing some relief. A steroid injection is scheduled for the end of the month. If ineffective, nerve ablation may be considered for longer-term relief, potentially lasting six to twelve months. Continue physical therapy focusing on mobility and strength. Generalized OA Generalized osteoarthritis contributes to chronic pain. Celecoxib  (Celebrex ) effectively manages headaches and shoulder pain but is less effective for back and nerve pain. Use Celebrex  instead of ibuprofen  or Aleve to avoid gastrointestinal issues, especially while reducing Nexium  dosage. Refill Celecoxib  prescription. Neuropathy Experiences nerve pain in the leg, which has calmed down. Continue physical therapy to improve mobility and manage neuropathy. Gastroesophageal reflux disease without esophagitis Currently on Esomeprazole  (Nexium ) 40 mg daily. Plan to reduce dose to 20 mg to eventually wean off due to potential long-term risks such as kidney disease and dementia. Alternate daily dosing to gradually reduce dependency.    ICD-10-CM   1. OSA (obstructive sleep apnea)  G47.33 Semaglutide -Weight Management 0.25 MG/0.5ML SOAJ    Semaglutide -Weight Management 0.5 MG/0.5ML SOAJ    Semaglutide -Weight Management 1 MG/0.5ML SOAJ    Semaglutide -Weight Management 1.7 MG/0.75ML SOAJ    Semaglutide -Weight Management 2.4 MG/0.75ML SOAJ    2. Mild intermittent asthma without complication  J45.20     3. Obesity with serious comorbidity, unspecified class, unspecified obesity  type  E66.9 Semaglutide -Weight Management 0.25 MG/0.5ML SOAJ    Semaglutide -Weight Management 0.5 MG/0.5ML SOAJ    Semaglutide -Weight Management 1 MG/0.5ML SOAJ     Semaglutide -Weight Management 1.7 MG/0.75ML SOAJ    Semaglutide -Weight Management 2.4 MG/0.75ML SOAJ    4. Gastroesophageal reflux disease, unspecified whether esophagitis present  K21.9 esomeprazole  (NEXIUM ) 20 MG packet    5. Chronic intractable pain  G89.29     6. Generalized OA  M15.9 celecoxib  (CELEBREX ) 200 MG capsule    7. Neuropathy  G62.9     8. Gastroesophageal reflux disease without esophagitis  K21.9       Meds ordered this encounter  Medications   Semaglutide -Weight Management 0.25 MG/0.5ML SOAJ    Sig: Inject 0.25 mg into the skin once a week for 28 days.    Dispense:  2 mL    Refill:  0   Semaglutide -Weight Management 0.5 MG/0.5ML SOAJ    Sig: Inject 0.5 mg into the skin once a week for 28 days.    Dispense:  2 mL    Refill:  0   Semaglutide -Weight Management 1 MG/0.5ML SOAJ    Sig: Inject 1 mg into the skin once a week for 28 days.    Dispense:  2 mL    Refill:  0   Semaglutide -Weight Management 1.7 MG/0.75ML SOAJ    Sig: Inject 1.7 mg into the skin once a week for 28 days.    Dispense:  3 mL    Refill:  0   Semaglutide -Weight Management 2.4 MG/0.75ML SOAJ    Sig: Inject 2.4 mg into the skin once a week for 28 days.    Dispense:  3 mL    Refill:  0   esomeprazole  (NEXIUM ) 20 MG packet    Sig: Take 20 mg by mouth daily before breakfast.    Dispense:  30 each    Refill:  12   celecoxib  (CELEBREX ) 200 MG capsule    Sig: Take 1 capsule (200 mg total) by mouth 2 (two) times daily.    Dispense:  60 capsule    Refill:  2    Treatment plan discussed and reviewed in detail. Explained medication safety and potential side effects.  Answered all patient questions and confirmed understanding and comfort with the plan. Encouraged patient to contact our office if they have any questions or concerns.  Agreed on patient coming for a sooner office visit if symptoms worsen, persist, or new symptoms develop. Discussed precautions in case of needing to visit the Emergency  Department.    ----------------------------------------------------- Attestation:  Today's Healthcare Provider Bernardino KANDICE Cone, MD was located at office at [facility] [address] [place] [address].  The patient was located at parked car. All video encounter participant identities and locations confirmed visually and verbally.Today's Telemedicine visit was conducted via synchronous Video after consent for telemedicine was obtained:  Video connection was never lost    This document was transcribed and resynthesized, in part, by artificial intelligence (Abridge) using HIPAA-compliant recording of the clinical interaction;   We have discussed the our use of AI scribe software for clinical note transcription with the patient, who has given verbal consent to proceed.

## 2023-12-05 NOTE — Assessment & Plan Note (Signed)
 Chronic intractable back pain is managed with trigger point injections, providing some relief. A steroid injection is scheduled for the end of the month. If ineffective, nerve ablation may be considered for longer-term relief, potentially lasting six to twelve months. Continue physical therapy focusing on mobility and strength.

## 2023-12-05 NOTE — Assessment & Plan Note (Signed)
 Experiencing weight issues contributing to back pain and sleep apnea. Start Wegovy  for weight loss, potentially aiding sleep apnea and overall health. Monitor for potential side effects such as nausea. Encourage resistance training and aqua therapy.

## 2023-12-05 NOTE — Assessment & Plan Note (Signed)
 Generalized osteoarthritis contributes to chronic pain. Celecoxib  (Celebrex ) effectively manages headaches and shoulder pain but is less effective for back and nerve pain. Use Celebrex  instead of ibuprofen  or Aleve to avoid gastrointestinal issues, especially while reducing Nexium  dosage. Refill Celecoxib  prescription.

## 2023-12-05 NOTE — Patient Instructions (Signed)
 VISIT SUMMARY:  Kyle Marshall, during your visit, we discussed your ongoing back pain, recent treatments, and other health concerns. You have been experiencing chronic back pain, and we reviewed your recent trigger point injections and upcoming steroid injection. We also talked about your generalized osteoarthritis, neuropathy, obstructive sleep apnea, weight management, asthma, and GERD. We have made some adjustments to your medications and treatment plan to help manage your symptoms more effectively.  YOUR PLAN:  -CHRONIC INTRACTABLE BACK PAIN: Chronic intractable back pain is persistent and difficult to manage. You recently had trigger point injections, which provided some relief, and you are scheduled for a steroid injection at the end of the month. If the steroid injection is not effective, we may consider nerve ablation for longer-term relief. Continue with physical therapy to improve your mobility and strength.  -GENERALIZED OSTEOARTHRITIS: Generalized osteoarthritis is a condition that causes joint pain and stiffness. Celecoxib  (Celebrex ) is helping with your headaches and shoulder pain but is less effective for your back and nerve pain. Continue using Celebrex  instead of ibuprofen  or Aleve to avoid gastrointestinal issues, especially as we reduce your Nexium  dosage. Your Celebrex  prescription has been refilled.  -NEUROPATHY: Neuropathy is nerve pain that you have been experiencing in your leg. It has calmed down somewhat. Continue with physical therapy to help improve your mobility and manage the neuropathy.  -OBSTRUCTIVE SLEEP APNEA: Obstructive sleep apnea is a condition where your breathing stops and starts during sleep. You are awaiting the results of your recent sleep study. Continue using a CPAP machine, preferably a ResMed model. Weight loss is recommended to help manage your sleep apnea, and we have suggested starting Wegovy  for weight loss, which may also improve your back pain and  breathing.  -WEIGHT MANAGEMENT: Your weight is contributing to your back pain and sleep apnea. We recommend starting Wegovy  for weight loss, which may help with your sleep apnea and overall health. Be aware of potential side effects like nausea. Additionally, engage in resistance training and aqua therapy to support your weight loss efforts.  -ASTHMA: Asthma is a condition that affects your airways and can cause coughing and breathlessness. You are not currently experiencing significant issues and are not using your inhaler regularly. You do experience some coughing and breathlessness when lying on your back, but it does not require frequent inhaler use.  -GASTROESOPHAGEAL REFLUX DISEASE (GERD): GERD is a condition where stomach acid frequently flows back into the tube connecting your mouth and stomach. You are currently taking Esomeprazole  (Nexium ) 40 mg daily. We plan to reduce your dose to 20 mg to eventually wean you off it due to potential long-term risks. Alternate daily dosing to gradually reduce your dependency.  INSTRUCTIONS:  Please schedule a follow-up appointment in one month to assess the effectiveness of your new medication regimen and overall progress. Ensure you pick up all new medications from the pharmacy.

## 2023-12-05 NOTE — Telephone Encounter (Signed)
 Pharmacy Patient Advocate Encounter   Received notification from Onbase that prior authorization for Wegovy  0.25MG /0.5ML auto-injectors is required/requested.   Insurance verification completed.   The patient is insured through [facility] .   Per test claim: PA required; PA submitted to above mentioned insurance via CoverMyMeds Key/confirmation #/EOC [id number] Status is pending

## 2023-12-05 NOTE — Assessment & Plan Note (Signed)
 Diagnosed with obstructive sleep apnea, awaiting sleep study results. Use a CPAP machine, preferably ResMed. Weight loss is suggested to help manage sleep apnea, with Wegovy  recommended for weight loss, potentially improving back pain and breathing.

## 2023-12-05 NOTE — Assessment & Plan Note (Signed)
 No significant issues with asthma currently, not using inhaler regularly. Experiences coughing and breathlessness when lying on his back but does not require frequent inhaler use.

## 2023-12-06 NOTE — Telephone Encounter (Signed)
-  Faxed additional information to [phone]

## 2023-12-07 ENCOUNTER — Other Ambulatory Visit (HOSPITAL_COMMUNITY): Payer: Self-pay

## 2023-12-07 NOTE — Telephone Encounter (Signed)
 Pharmacy Patient Advocate Encounter  Received notification from [facility] that Prior Authorization for Wegovy  0.25MG /0.5ML auto-injectors has been APPROVED from 12/06/23 to 06/03/24   PA #/Case ID/Reference #: [id number]

## 2023-12-07 NOTE — Progress Notes (Signed)
 See procedure note.

## 2023-12-11 ENCOUNTER — Ambulatory Visit: Payer: Self-pay | Admitting: Neurology

## 2023-12-11 ENCOUNTER — Other Ambulatory Visit (HOSPITAL_COMMUNITY): Payer: Self-pay

## 2023-12-11 ENCOUNTER — Telehealth: Payer: Self-pay

## 2023-12-11 NOTE — Telephone Encounter (Addendum)
 Pharmacy Patient Advocate Encounter   Received notification from Onbase that prior authorization for Esomeprazole  Magnesium  20MG  packets is required/requested.   Insurance verification completed.   The patient is insured through [facility] .   Per test claim:  NEXIUM  PACKETS  is preferred by the insurance.  If suggested medication is appropriate, Please send in a new RX and discontinue this one. If not, please advise as to why it's not appropriate so that we may request a Prior Authorization. Please note, some preferred medications may still require a PA.  If the suggested medications have not been trialed and there are no contraindications to their use, the PA will not be submitted, as it will not be approved.   -Called pharmacy and informed.

## 2023-12-11 NOTE — Procedures (Signed)
 [facility]  HOME SLEEP TEST (SANSA) REPORT (Mail-Out Device):   STUDY DATE: 12/01/23  DOB: [birth year]  MRN: [id number]  ORDERING CLINICIAN: True Mar, MD, PhD   REFERRING CLINICIAN: Jesus Bernardino MATSU, MD   CLINICAL INFORMATION/HISTORY: 33 year old male with an underlying medical history of chronic pain (secondary to prior multiple bony injuries), including low back pain, knee pain, hip pain, history of kidney stone, history of neuropathy, hypertension, allergic rhinitis, asthma, and obesity, who reports snoring and excessive daytime somnolence as well as witnessed apneas.   PATIENT'S LAST REPORTED EPWORTH SLEEPINESS SCORE (ESS): 2/24.  BMI (at the time of sleep clinic visit and/or test date): 36.3 kg/m  FINDINGS:   Study Protocol:    The SANSA single-point-of-skin-contact chest-worn sensor - an FDA cleared and DOT approved type 4 home sleep test device - measures eight physiological channels,  including blood oxygen saturation (measured via PPG [photoplethysmography]), EKG-derived heart rate, respiratory effort, chest movement (measured via accelerometer), snoring, body position, and actigraphy. The device is designed to be worn for up to 10 hours per study.   Sleep Summary:   Total Recording Time (hours, min): 8 hours, 29 min  Total Effective Sleep Time (hours, min):  3 hours, 59 min  Sleep Efficiency (%):    48%   Respiratory Indices:   Calculated sAHI (per hour):  3.5/hour         Oxygen Saturation Statistics:    Oxygen Saturation (%) Mean: 96.1%   Minimum oxygen saturation (%):                 71%   O2 Saturation Range (%): 71-100%   Time below or at 88% saturation: 2 min   Pulse Rate Statistics:   Pulse Mean (bpm):    88/min    Pulse Range (53-123/min)   Snoring: intermittent, mild to moderate   IMPRESSION/DIAGNOSES:   Primary snoring  RECOMMENDATIONS:   This home sleep test does not demonstrate any significant obstructive or  central sleep disordered breathing with a total AHI of less than 5/hour. His total AHI was 3.5/hour (utilizing the 4% desaturation criteria for obstructive hypopneas per Medicaid guidelines), and O2 nadir was 71%, but there were errors in the O2 sensor, with dislodging noted. Time below or at 88% saturation was 2 minutes for the night. Snoring was intermittent, in the mild to moderate range.  This study was limited secondary to sleep efficiency of less than 50%, total sleep time of just about 4 hours.  Treatment with a positive airway pressure device such as AutoPap or CPAP is not indicated based on this test. Snoring may improve with avoidance of the supine sleep position and weight loss (where clinically appropriate).   For disturbing snoring, an oral appliance through dentistry or orthodontics can be considered.  Other causes of the patient's symptoms, including circadian rhythm disturbances, an underlying mood disorder, medication effect and/or an underlying medical problem cannot be ruled out based on this test. Clinical correlation is recommended.  The patient should be cautioned not to drive, work at heights, or operate dangerous or heavy equipment when tired or sleepy. Review and reiteration of good sleep hygiene measures should be pursued with any patient. The patient will be advised to follow up with his referring provider, who will be notified of the test results.   I certify that I have reviewed the raw data recording prior to the issuance of this report in accordance with the standards of the American Academy of Sleep Medicine (AASM).  INTERPRETING PHYSICIAN:   True Mar, MD, PhD Medical Director, [facility]) Diplomat, ABPN (Neurology and Sleep)   [facility] [address], [address] [place], [place] [address] [phone]

## 2023-12-11 NOTE — Patient Instructions (Signed)
 [facility]  HOME SLEEP TEST (SANSA) REPORT (Mail-Out Device):   STUDY DATE: 12/01/23  DOB: Jun 07, 1990  MRN: [id number]  ORDERING CLINICIAN: True Mar, MD, PhD   REFERRING CLINICIAN: Jesus Bernardino MATSU, MD   CLINICAL INFORMATION/HISTORY: 33 year old male with an underlying medical history of chronic pain (secondary to prior multiple bony injuries), including low back pain, knee pain, hip pain, history of kidney stone, history of neuropathy, hypertension, allergic rhinitis, asthma, and obesity, who reports snoring and excessive daytime somnolence as well as witnessed apneas.   PATIENT'S LAST REPORTED EPWORTH SLEEPINESS SCORE (ESS): 2/24.  BMI (at the time of sleep clinic visit and/or test date): 36.3 kg/m  FINDINGS:   Study Protocol:    The SANSA single-point-of-skin-contact chest-worn sensor - an FDA cleared and DOT approved type 4 home sleep test device - measures eight physiological channels,  including blood oxygen saturation (measured via PPG [photoplethysmography]), EKG-derived heart rate, respiratory effort, chest movement (measured via accelerometer), snoring, body position, and actigraphy. The device is designed to be worn for up to 10 hours per study.   Sleep Summary:   Total Recording Time (hours, min): 8 hours, 29 min  Total Effective Sleep Time (hours, min):  3 hours, 59 min  Sleep Efficiency (%):    48%   Respiratory Indices:   Calculated sAHI (per hour):  3.5/hour         Oxygen Saturation Statistics:    Oxygen Saturation (%) Mean: 96.1%   Minimum oxygen saturation (%):                 71%   O2 Saturation Range (%): 71-100%   Time below or at 88% saturation: 2 min   Pulse Rate Statistics:   Pulse Mean (bpm):    88/min    Pulse Range (53-123/min)   Snoring: intermittent, mild to moderate   IMPRESSION/DIAGNOSES:   Primary snoring  RECOMMENDATIONS:   This home sleep test does not demonstrate any significant obstructive or  central sleep disordered breathing with a total AHI of less than 5/hour. His total AHI was 3.5/hour (utilizing the 4% desaturation criteria for obstructive hypopneas per Medicaid guidelines), and O2 nadir was 71%, but there were errors in the O2 sensor, with dislodging noted. Time below or at 88% saturation was 2 minutes for the night. Snoring was intermittent, in the mild to moderate range. Treatment with a positive airway pressure device such as AutoPap or CPAP is not indicated based on this test. Snoring may improve with avoidance of the supine sleep position and weight loss (where clinically appropriate).   For disturbing snoring, an oral appliance through dentistry or orthodontics can be considered.  Other causes of the patient's symptoms, including circadian rhythm disturbances, an underlying mood disorder, medication effect and/or an underlying medical problem cannot be ruled out based on this test. Clinical correlation is recommended.  The patient should be cautioned not to drive, work at heights, or operate dangerous or heavy equipment when tired or sleepy. Review and reiteration of good sleep hygiene measures should be pursued with any patient. The patient will be advised to follow up with his referring provider, who will be notified of the test results.   I certify that I have reviewed the raw data recording prior to the issuance of this report in accordance with the standards of the American Academy of Sleep Medicine (AASM).  INTERPRETING PHYSICIAN:   True Mar, MD, PhD Medical Director, [facility]) Diplomat, ABPN (Neurology and  Sleep)   [facility] [address], [address] [place], [place] [address] [phone]

## 2023-12-12 NOTE — Telephone Encounter (Signed)
 Spoke with patient and discussed his sleep study results. The patient verbalized understanding. He would like to work on getting his implants so that he can then look into getting an oral appliance. Currently he cannot wear his dentures because of his wisdom teeth. He will call us  back when he decides if he wants to move forward with referral for oral appliance.

## 2023-12-12 NOTE — Telephone Encounter (Signed)
-----   Message from True Mar sent at 12/11/2023  5:48 PM EDT ----- See MyChart message to patient, FYI.  ----- Message ----- From: Mar True, MD Sent: 12/11/2023   5:47 PM EDT To: True Mar, MD

## 2024-02-14 ENCOUNTER — Other Ambulatory Visit: Payer: Self-pay

## 2024-02-14 NOTE — Patient Instructions (Signed)
 Visit Information  Thank you for taking time to visit with me today. Please don't hesitate to contact me if I can be of assistance to you before our next scheduled appointment.   Please call the care guide team at [phone] if you need to cancel, schedule, or reschedule an appointment.   Please call 911 if you are experiencing a Mental Health or Behavioral Health Crisis or need someone to talk to.  Kyle Marshall, BSW [place]  [facility], [facility] Social Worker Direct Dial: [phone]  Fax: [phone] Website: [email or web address]

## 2024-02-14 NOTE — Patient Outreach (Signed)
 Complex Care Management   Visit Note  02/14/2024  Name:  Kyle Marshall MRN: [id number] DOB: Nov 01, 1990  Situation: Referral received for Complex Care Management related to SDOH Barriers:  Financial Resource Strain I obtained verbal consent from Patient.  Visit completed with Patient  on the phone  Background:   Past Medical History:  Diagnosis Date   Abdominal pain 02/12/2008   Allergic rhinitis 12/08/2014   Asthma    Attention and concentration deficit 05/31/2020   Concussion with loss of consciousness 10/10/2019   Difficulty hearing 02/09/2015   Seizure-like activity (HCC) 10/11/2018   Suspected COVID-19 virus infection 06/17/2019   Witnessed seizure-like activity (HCC) 10/14/2018    Assessment: Patient reports he has an attorney and is waiting for his appointment to present his case before the judge. Patients license has been revoked due to seizure medication changes. Case is closed as patient await disability process.   SDOH Interventions    Flowsheet Row Patient Outreach Telephone from 10/09/2023 in [place] POPULATION HEALTH DEPARTMENT Patient Outreach from 08/30/2023 in [place] POPULATION HEALTH DEPARTMENT  SDOH Interventions    Food Insecurity Interventions Intervention Not Indicated Other (Comment)  [Receives foodstamps but runs low, provided food banks]  Housing Interventions Intervention Not Indicated Intervention Not Indicated  Transportation Interventions Intervention Not Indicated Intervention Not Indicated  [Has a car and Medicaid Transportation]  Utilities Interventions Intervention Not Indicated Intervention Not Indicated  Financial Strain Interventions Other (Comment)  [Waiting on disability approval, family providing support] Other (Comment)  [Patient has applied for disability and referred to apply for DSS/[employer]]      Recommendation:   None  Follow Up Plan:   Patient has met all care management goals. Care Management case will be closed.  Patient has been provided contact information should new needs arise.   Tillman Gardener, BSW [place]  [facility], [facility] Social Worker Direct Dial: [phone]  Fax: [phone] Website: [email or web address]

## 2024-02-19 ENCOUNTER — Other Ambulatory Visit: Payer: Self-pay | Admitting: Internal Medicine

## 2024-02-19 DIAGNOSIS — I10 Essential (primary) hypertension: Secondary | ICD-10-CM

## 2024-02-19 MED ORDER — AMLODIPINE BESYLATE 5 MG PO TABS
5.0000 mg | ORAL_TABLET | Freq: Every day | ORAL | 2 refills | Status: AC
Start: 2024-02-19 — End: ?

## 2024-02-19 NOTE — Telephone Encounter (Signed)
 Copied from CRM [id number]. Topic: Clinical - Medication Refill >> Feb 19, 2024 10:50 AM Suzen RAMAN wrote: Medication: amLODipine  (NORVASC ) 5 MG tablet   Has the patient contacted their pharmacy? Yes   This is the patient's preferred pharmacy:  [id number] - [place], [place] - [address] [place] [address] Phone: [phone] Fax: [phone]  Is this the correct pharmacy for this prescription? Yes If no, delete pharmacy and type the correct one.   Has the prescription been filled recently? No  Is the patient out of the medication? Yes  Has the patient been seen for an appointment in the last year OR does the patient have an upcoming appointment? Yes  Can we respond through MyChart? Yes  Agent: Please be advised that Rx refills may take up to 3 business days. We ask that you follow-up with your pharmacy.

## 2024-03-25 ENCOUNTER — Encounter: Payer: Self-pay | Admitting: Radiology
